# Patient Record
Sex: Male | Born: 1975 | Race: Black or African American | Hispanic: No | Marital: Single | Smoking: Current every day smoker
Health system: Southern US, Community
[De-identification: ages and names within clinical notes are randomized; demographics above are authoritative.]

## PROBLEM LIST (undated history)

## (undated) DIAGNOSIS — I10 Essential (primary) hypertension: Secondary | ICD-10-CM

## (undated) HISTORY — DX: Essential (primary) hypertension: I10

---

## 2009-01-18 ENCOUNTER — Ambulatory Visit: Payer: Self-pay | Admitting: Internal Medicine

## 2009-01-18 DIAGNOSIS — F528 Other sexual dysfunction not due to a substance or known physiological condition: Secondary | ICD-10-CM | POA: Insufficient documentation

## 2009-01-18 LAB — CONVERTED CEMR LAB
Albumin: 4.1 g/dL (ref 3.5–5.2)
Alkaline Phosphatase: 93 units/L (ref 39–117)
Basophils Absolute: 0.1 10*3/uL (ref 0.0–0.1)
Bilirubin Urine: NEGATIVE
CO2: 32 meq/L (ref 19–32)
Calcium: 9.3 mg/dL (ref 8.4–10.5)
Creatinine, Ser: 0.7 mg/dL (ref 0.4–1.5)
Eosinophils Absolute: 0 10*3/uL (ref 0.0–0.7)
Glucose, Bld: 83 mg/dL (ref 70–99)
HDL: 41 mg/dL (ref >39.00)
Hemoglobin, Urine: NEGATIVE
Hemoglobin: 16.8 g/dL (ref 13.0–17.0)
Ketones, ur: NEGATIVE mg/dL
Lymphocytes Relative: 35.7 % (ref 12.0–46.0)
MCHC: 35.4 g/dL (ref 30.0–36.0)
Neutro Abs: 3 10*3/uL (ref 1.4–7.7)
Neutrophils Relative %: 55.5 % (ref 43.0–77.0)
RDW: 13.5 % (ref 11.5–14.6)
Total Protein, Urine: NEGATIVE mg/dL
Triglycerides: 58 mg/dL (ref 0.0–149.0)
Urine Glucose: NEGATIVE mg/dL
VLDL: 11.6 mg/dL (ref 0.0–40.0)

## 2009-01-20 LAB — CONVERTED CEMR LAB: GC Probe Amp, Urine: NEGATIVE

## 2014-12-20 ENCOUNTER — Emergency Department (HOSPITAL_COMMUNITY)
Admission: EM | Admit: 2014-12-20 | Discharge: 2014-12-20 | Disposition: A | Discharge status: Home / Self Care | Payer: Managed Care, Other (non HMO) | Source: Home / Self Care | Attending: Emergency Medicine | Admitting: Emergency Medicine

## 2014-12-20 ENCOUNTER — Encounter (HOSPITAL_COMMUNITY): Payer: Self-pay | Admitting: Emergency Medicine

## 2014-12-20 DIAGNOSIS — H00013 Hordeolum externum right eye, unspecified eyelid: Secondary | ICD-10-CM

## 2014-12-20 MED ORDER — POLYMYXIN B-TRIMETHOPRIM 10000-0.1 UNIT/ML-% OP SOLN: 1.0000 [drp] | OPHTHALMIC | Status: DC

## 2014-12-20 NOTE — ED Notes (Signed)
Pt states that he has had eye irritation for 2 days in his right eye

## 2014-12-20 NOTE — Discharge Instructions (Signed)
You have a stye. Apply warm compresses as often as you can. Use the antibiotic eyedrop every 4 hours for the next 5 days. If no improvement by Thursday, please follow-up with eye doctor.

## 2014-12-20 NOTE — ED Provider Notes (Signed)
CSN: [id number]     Arrival date & time 12/20/14  [id number] History   First MD Initiated Contact with Patient 12/20/14 1125     No chief complaint on file.  (Consider location/radiation/quality/duration/timing/severity/associated sxs/prior Treatment) HPI He is a 39 year old man here for evaluation of right eye issue. He states when he woke up on Friday morning he had swelling and discomfort in the right upper eyelid. He reports some mild itching. He has been using over-the-counter eyedrops with minimal improvement. He does report some intermittent crusty drainage. No change in his vision. No redness of that eye.  No past medical history on file. No past surgical history on file. No family history on file. History  Substance Use Topics  . Smoking status: Not on file  . Smokeless tobacco: Not on file  . Alcohol Use: Not on file    Review of Systems  Constitutional: Negative for fever.  Eyes: Positive for pain, discharge and itching. Negative for photophobia, redness and visual disturbance.    Allergies  Review of patient's allergies indicates not on file.  Home Medications   Prior to Admission medications   Medication Sig Start Date End Date Taking? Authorizing Provider  trimethoprim-polymyxin b (POLYTRIM) ophthalmic solution Place 1 drop into the right eye every 4 (four) hours. For 5 days 12/20/14   Charm RingsErin J Honig, MD   BP 129/86 mmHg  Pulse 90  Temp(Src) 99.4 F (37.4 C) (Oral)  Resp 16  SpO2 98% Physical Exam  Constitutional: He is oriented to person, place, and time. He appears well-developed and well-nourished. No distress.  Eyes: Conjunctivae and EOM are normal. Pupils are equal, round, and reactive to light. Lids are everted and swept, no foreign bodies found. Right eye exhibits hordeolum.  Swelling and mild erythema of right upper eyelid  Cardiovascular: Normal rate.   Pulmonary/Chest: Effort normal.  Neurological: He is alert and oriented to person, place, and time.     ED Course  Procedures (including critical care time) Labs Review Labs Reviewed - No data to display  Imaging Review No results found.   MDM   1. Stye, right    Warm compresses. Polytrim eyedrops. Follow-up with eye doctor if no improvement in 4 days.    Charm RingsErin J Honig, MD 12/20/14 1149

## 2019-09-29 ENCOUNTER — Other Ambulatory Visit: Payer: Self-pay

## 2019-09-29 ENCOUNTER — Encounter (HOSPITAL_COMMUNITY): Payer: Self-pay

## 2019-09-29 ENCOUNTER — Emergency Department (HOSPITAL_COMMUNITY)
Admission: EM | Admit: 2019-09-29 | Discharge: 2019-09-29 | Disposition: A | Discharge status: Home / Self Care | Payer: 59 | Attending: Emergency Medicine | Admitting: Emergency Medicine

## 2019-09-29 ENCOUNTER — Emergency Department (HOSPITAL_COMMUNITY): Payer: 59

## 2019-09-29 DIAGNOSIS — I739 Peripheral vascular disease, unspecified: Secondary | ICD-10-CM

## 2019-09-29 DIAGNOSIS — Z20822 Contact with and (suspected) exposure to covid-19: Secondary | ICD-10-CM | POA: Diagnosis not present

## 2019-09-29 DIAGNOSIS — R945 Abnormal results of liver function studies: Secondary | ICD-10-CM | POA: Insufficient documentation

## 2019-09-29 DIAGNOSIS — R21 Rash and other nonspecific skin eruption: Secondary | ICD-10-CM | POA: Insufficient documentation

## 2019-09-29 DIAGNOSIS — R7989 Other specified abnormal findings of blood chemistry: Secondary | ICD-10-CM

## 2019-09-29 LAB — COMPREHENSIVE METABOLIC PANEL
ALT: 22 U/L (ref 0–44)
AST: 14 U/L — ABNORMAL LOW (ref 15–41)
Albumin: 3.6 g/dL (ref 3.5–5.0)
Alkaline Phosphatase: 173 U/L — ABNORMAL HIGH (ref 38–126)
Anion gap: 8 (ref 5–15)
BUN: 22 mg/dL — ABNORMAL HIGH (ref 6–20)
CO2: 28 mmol/L (ref 22–32)
Calcium: 9.1 mg/dL (ref 8.9–10.3)
Chloride: 97 mmol/L — ABNORMAL LOW (ref 98–111)
Creatinine, Ser: 0.71 mg/dL (ref 0.61–1.24)
GFR calc Af Amer: >60 mL/min (ref >60)
GFR calc non Af Amer: >60 mL/min (ref >60)
Glucose, Bld: 93 mg/dL (ref 70–99)
Potassium: 5 mmol/L (ref 3.5–5.1)
Sodium: 133 mmol/L — ABNORMAL LOW (ref 135–145)
Total Bilirubin: 0.7 mg/dL (ref 0.3–1.2)
Total Protein: 8 g/dL (ref 6.5–8.1)

## 2019-09-29 LAB — HEPATITIS PANEL, ACUTE
HCV Ab: NONREACTIVE
Hep A IgM: NONREACTIVE
Hep B C IgM: NONREACTIVE
Hepatitis B Surface Ag: NONREACTIVE

## 2019-09-29 LAB — CBC
HCT: 43.3 % (ref 39.0–52.0)
Hemoglobin: 14.6 g/dL (ref 13.0–17.0)
MCH: 36.3 pg — ABNORMAL HIGH (ref 26.0–34.0)
MCHC: 33.7 g/dL (ref 30.0–36.0)
MCV: 107.7 fL — ABNORMAL HIGH (ref 80.0–100.0)
Platelets: 336 10*3/uL (ref 150–400)
RBC: 4.02 MIL/uL — ABNORMAL LOW (ref 4.22–5.81)
RDW: 14.6 % (ref 11.5–15.5)
WBC: 23.3 10*3/uL — ABNORMAL HIGH (ref 4.0–10.5)
nRBC: 0.1 % (ref 0.0–0.2)

## 2019-09-29 LAB — LACTIC ACID, PLASMA: Lactic Acid, Venous: 1.1 mmol/L (ref 0.5–1.9)

## 2019-09-29 LAB — HIV ANTIBODY (ROUTINE TESTING W REFLEX): HIV Screen 4th Generation wRfx: NONREACTIVE

## 2019-09-29 LAB — RESPIRATORY PANEL BY RT PCR (FLU A&B, COVID)
Influenza A by PCR: NEGATIVE
Influenza B by PCR: NEGATIVE
SARS Coronavirus 2 by RT PCR: NEGATIVE

## 2019-09-29 LAB — LIPASE, BLOOD: Lipase: 150 U/L — ABNORMAL HIGH (ref 11–51)

## 2019-09-29 MED ORDER — SODIUM CHLORIDE 0.9 % IV BOLUS
1000.0000 mL | Freq: Once | INTRAVENOUS | Status: AC
Start: 2019-09-29 — End: 2019-09-29
  Administered 2019-09-29: 1000 mL via INTRAVENOUS

## 2019-09-29 MED ORDER — PREDNISONE 10 MG (21) PO TBPK: ORAL_TABLET | ORAL | 0 refills | Status: DC

## 2019-09-29 MED ORDER — DIPHENHYDRAMINE HCL 50 MG/ML IJ SOLN
25.0000 mg | Freq: Once | INTRAMUSCULAR | Status: AC
Start: 2019-09-29 — End: 2019-09-29
  Administered 2019-09-29: 25 mg via INTRAVENOUS
  Filled 2019-09-29: qty 1

## 2019-09-29 MED ORDER — IOHEXOL 300 MG/ML  SOLN
100.0000 mL | Freq: Once | INTRAMUSCULAR | Status: AC | PRN
  Administered 2019-09-29: 100 mL via INTRAVENOUS

## 2019-09-29 MED ORDER — SODIUM CHLORIDE (PF) 0.9 % IJ SOLN
INTRAMUSCULAR | Status: AC
  Filled 2019-09-29: qty 50

## 2019-09-29 MED ORDER — SODIUM CHLORIDE 0.9 % IV SOLN: INTRAVENOUS | Status: DC

## 2019-09-29 MED ORDER — DEXAMETHASONE SODIUM PHOSPHATE 10 MG/ML IJ SOLN
10.0000 mg | Freq: Once | INTRAMUSCULAR | Status: AC
  Administered 2019-09-29: 10 mg via INTRAVENOUS
  Filled 2019-09-29: qty 1

## 2019-09-29 MED ORDER — FAMOTIDINE IN NACL 20-0.9 MG/50ML-% IV SOLN
20.0000 mg | Freq: Once | INTRAVENOUS | Status: AC
  Administered 2019-09-29: 20 mg via INTRAVENOUS
  Filled 2019-09-29: qty 50

## 2019-09-29 NOTE — ED Provider Notes (Signed)
[facility] DEPT Provider Note   CSN: [id number] Arrival date & time: 09/29/19  1250     History Chief Complaint  Patient presents with  . Rash  . Abnormal Lab    Jesse Mendoza is a 44 y.o. male.  Pt presents to the ED today with a rash and abnormal labs.  Pt said Jesse Mendoza's had a rash for about a week. Jesse Mendoza went to UC last week and had blood work done and was then put on prednisone.  WBC elevated and LFTs elevated, so they told him to come back for repeat blood work.   WBC is still high and amylase/lipase are elevated.  Pt said the prednisone helped the rash a little, but it is still there.  Jesse Mendoza said it's itchy.  No f/c.  No abdominal pain.  Jesse Mendoza does drink daily.        History reviewed. No pertinent past medical history.  Patient Active Problem List   Diagnosis Date Noted  . ERECTILE DYSFUNCTION 01/18/2009    History reviewed. No pertinent surgical history.     Family History  Problem Relation Age of Onset  . Diabetes Mother     Social History   Tobacco Use  . Smoking status: Current Every Day Smoker    Packs/day: 0.50    Types: Cigarettes, Cigars  . Smokeless tobacco: Never Used  Substance Use Topics  . Alcohol use: Yes    Comment: about 4 times a week  . Drug use: Yes    Types: Marijuana    Comment: occasionally    Home Medications Prior to Admission medications   Medication Sig Start Date End Date Taking? Authorizing Provider  Ascorbic Acid (VITAMIN C) 1000 MG tablet Take 1,000 mg by mouth daily.   Yes [provider]  predniSONE (STERAPRED UNI-PAK 21 TAB) 10 MG (21) TBPK tablet Take 6 tabs for 2 days, then 5 for 2 days, then 4 for 2 days, then 3 for 2 days, 2 for 2 days, then 1 for 2 days 09/29/19   Isla Pence, MD  trimethoprim-polymyxin b (POLYTRIM) ophthalmic solution Place 1 drop into the right eye every 4 (four) hours. For 5 days Patient not taking: Reported on 09/29/2019 12/20/14   Melony Overly, MD    Allergies     Patient has no known allergies.  Review of Systems   Review of Systems  Skin: Positive for rash.  All other systems reviewed and are negative.   Physical Exam Updated Vital Signs BP (!) 151/98 (BP Location: Right Arm)   Pulse 97   Temp 98.7 F (37.1 C) (Oral)   Resp 15   Ht 5\' 6"  (1.676 m)   Wt 68 kg   SpO2 100%   BMI 24.21 kg/m   Physical Exam Vitals and nursing note reviewed.  Constitutional:      Appearance: Normal appearance.  HENT:     Head: Normocephalic and atraumatic.     Right Ear: External ear normal.     Left Ear: External ear normal.     Nose: Nose normal.     Mouth/Throat:     Mouth: Mucous membranes are moist.     Pharynx: Oropharynx is clear.     Comments: No intra-oral lesions Eyes:     Extraocular Movements: Extraocular movements intact.     Conjunctiva/sclera: Conjunctivae normal.     Pupils: Pupils are equal, round, and reactive to light.  Cardiovascular:     Rate and Rhythm: Regular rhythm. Tachycardia  present.     Pulses: Normal pulses.     Heart sounds: Normal heart sounds.  Pulmonary:     Effort: Pulmonary effort is normal.     Breath sounds: Normal breath sounds.  Abdominal:     General: Abdomen is flat. Bowel sounds are normal.     Palpations: Abdomen is soft.  Musculoskeletal:        General: Normal range of motion.     Cervical back: Normal range of motion and neck supple.  Skin:    Capillary Refill: Capillary refill takes less than 2 seconds.     Comments: See picture. Rash is mainly on arms and trunk.  Very little on LE.  No palmar lesions.  Neurological:     General: No focal deficit present.     Mental Status: Jesse Mendoza is alert and oriented to person, place, and time.  Psychiatric:        Mood and Affect: Mood normal.        Behavior: Behavior normal.        Thought Content: Thought content normal.        Judgment: Judgment normal.       ED Results / Procedures / Treatments   Labs (all labs ordered are listed, but only  abnormal results are displayed) Labs Reviewed  LIPASE, BLOOD - Abnormal; Notable for the following components:      Result Value   Lipase 150 (*)    All other components within normal limits  COMPREHENSIVE METABOLIC PANEL - Abnormal; Notable for the following components:   Sodium 133 (*)    Chloride 97 (*)    BUN 22 (*)    AST 14 (*)    Alkaline Phosphatase 173 (*)    All other components within normal limits  CBC - Abnormal; Notable for the following components:   WBC 23.3 (*)    RBC 4.02 (*)    MCV 107.7 (*)    MCH 36.3 (*)    All other components within normal limits  RESPIRATORY PANEL BY RT PCR (FLU A&B, COVID)  CULTURE, BLOOD (ROUTINE X 2)  CULTURE, BLOOD (ROUTINE X 2)  LACTIC ACID, PLASMA  URINALYSIS, ROUTINE W REFLEX MICROSCOPIC  RPR  HIV ANTIBODY (ROUTINE TESTING W REFLEX)  HEPATITIS PANEL, ACUTE  LACTIC ACID, PLASMA    EKG None  Radiology CT ABDOMEN PELVIS W CONTRAST  Result Date: 09/29/2019 CLINICAL DATA:  Multifocal skin rash, jaundice EXAM: CT ABDOMEN AND PELVIS WITH CONTRAST TECHNIQUE: Multidetector CT imaging of the abdomen and pelvis was performed using the standard protocol following bolus administration of intravenous contrast. CONTRAST:  OMNIPAQUE IOHEXOL 300 MG/ML  SOLN COMPARISON:  None. FINDINGS: Lower chest: No acute pleural or parenchymal lung disease. Hepatobiliary: No focal liver abnormality is seen. No gallstones, gallbladder wall thickening, or biliary dilatation. Pancreas: Unremarkable. No pancreatic ductal dilatation or surrounding inflammatory changes. Spleen: Normal in size without focal abnormality. Adrenals/Urinary Tract: Adrenal glands are unremarkable. Kidneys are normal, without renal calculi, focal lesion, or hydronephrosis. Bladder is unremarkable. Stomach/Bowel: No bowel obstruction or ileus. Normal gas-filled appendix right lower quadrant. Vascular/Lymphatic: No pathologic lymphadenopathy within the abdomen or pelvis. Moderate  atherosclerosis at the aortic bifurcation, with focal high-grade stenosis right common iliac artery estimated greater than 50%. The left superficial femoral artery is not identified and may be chronically occluded. The left profundus femoral is widely patent. Reproductive: Prostate is unremarkable. Other: No abdominal wall hernia or abnormality. No abdominopelvic ascites. Musculoskeletal: No acute displaced fracture. Reconstructed images  demonstrate no additional findings. IMPRESSION: 1. No acute intra-abdominal or intrapelvic process. No etiology to explain the clinical findings of jaundice. 2. Multifocal peripheral vascular disease involving the right common iliac and left superficial femoral artery. Please correlate with any symptoms of lower extremity claudication. Electronically Signed   By: Sharlet Salina M.D.   On: 09/29/2019 16:28    Procedures Procedures (including critical care time)  Medications Ordered in ED Medications  sodium chloride 0.9 % bolus 1,000 mL (0 mLs Intravenous Stopped 09/29/19 1724)    And  0.9 %  sodium chloride infusion ( Intravenous New Bag/Given 09/29/19 1537)  sodium chloride (PF) 0.9 % injection (has no administration in time range)  diphenhydrAMINE (BENADRYL) injection 25 mg (25 mg Intravenous Given 09/29/19 1538)  dexamethasone (DECADRON) injection 10 mg (10 mg Intravenous Given 09/29/19 1539)  famotidine (PEPCID) IVPB 20 mg premix (0 mg Intravenous Stopped 09/29/19 1724)  iohexol (OMNIPAQUE) 300 MG/ML solution 100 mL (100 mLs Intravenous Contrast Given 09/29/19 1559)    ED Course  I have reviewed the triage vital signs and the nursing notes.  Pertinent labs & imaging results that were available during my care of the patient were reviewed by me and considered in my medical decision making (see chart for details).    MDM Rules/Calculators/A&P                      Pt is afebrile and not septic appearing.  Jesse Mendoza is well appearing.  RPR and HIV and Hep C tests pending.   Pt's CT abd/pelvis negative other than peripheral vascular disease.  Pt's lfts are likely elevated due to alcohol use.  Pt is encouraged to stop smoking and drinking.  Jesse Mendoza does not have a pcp, but does have medical insurance.  Jesse Mendoza is told to establish with pcp.  Jesse Mendoza is given the number of vascular to f/u due to the severe pvd noted on CT.  Pt dose not have any leg pain.  Pt is stable for d/c.  Return if worse.   Final Clinical Impression(s) / ED Diagnoses Final diagnoses:  Rash  Elevated LFTs  Peripheral vascular disease (HCC)    Rx / DC Orders ED Discharge Orders         Ordered    predniSONE (STERAPRED UNI-PAK 21 TAB) 10 MG (21) TBPK tablet     09/29/19 1739           Jacalyn Lefevre, MD 09/29/19 1740

## 2019-09-29 NOTE — ED Triage Notes (Signed)
Patient reports that he has a rash on his hands, chest, back and genital area x 2 weeks. Patient states he went to an UC a week ago and had labs completed. Patient went back 2 days ago and labs repeated. Patient was told to come to the Ed for abnormal labs.  WBC-21.9, amylase-426, lipase-357

## 2019-09-29 NOTE — Discharge Instructions (Addendum)
Try to decrease alcohol intake.  Try to stop smoking.  Start new prednisone taper tomorrow.

## 2019-09-30 LAB — RPR
RPR Ser Ql: REACTIVE — AB
RPR Titer: >16

## 2019-10-01 LAB — T.PALLIDUM AB, TOTAL: T Pallidum Abs: REACTIVE — AB

## 2019-10-02 ENCOUNTER — Other Ambulatory Visit: Payer: Self-pay

## 2019-10-02 ENCOUNTER — Emergency Department (HOSPITAL_COMMUNITY)
Admission: EM | Admit: 2019-10-02 | Discharge: 2019-10-02 | Disposition: A | Discharge status: Home / Self Care | Payer: 59 | Attending: Emergency Medicine | Admitting: Emergency Medicine

## 2019-10-02 DIAGNOSIS — F17210 Nicotine dependence, cigarettes, uncomplicated: Secondary | ICD-10-CM | POA: Diagnosis not present

## 2019-10-02 DIAGNOSIS — A539 Syphilis, unspecified: Secondary | ICD-10-CM | POA: Diagnosis not present

## 2019-10-02 DIAGNOSIS — Z79899 Other long term (current) drug therapy: Secondary | ICD-10-CM | POA: Insufficient documentation

## 2019-10-02 MED ORDER — PENICILLIN G BENZATHINE 1200000 UNIT/2ML IM SUSP
2.4000 10*6.[IU] | Freq: Once | INTRAMUSCULAR | Status: AC
  Administered 2019-10-02: 2.4 10*6.[IU] via INTRAMUSCULAR
  Filled 2019-10-02: qty 4

## 2019-10-02 NOTE — Discharge Instructions (Addendum)
You have been diagnosed today with syphilis.  At this time there does not appear to be the presence of an emergent medical condition, however there is always the potential for conditions to change. Please read and follow the below instructions.  Please return to the Emergency Department immediately for any new or worsening symptoms. Please be sure to follow up with your Primary Care Provider within one week regarding your visit today; please call their office to schedule an appointment even if you are feeling better for a follow-up visit. You have been treated today for syphilis.  You will need a follow-up syphilis test called an RPR in 6 weeks - 3 months.  You may schedule this through your primary care doctor or through the health department.  Additionally your gonorrhea and Chlamydia tests are currently pending.  Please follow-up on the results on your MyChart, in 2-3 days.  If they are positive please seek treatment through your primary care doctor's office, the health department or if necessary here at the emergency department.  Please inform all of your sexual contacts/partners you have had for the past 2 years that they need to be tested and treated as well.  Please use safe sex with every sexual encounter.  Please avoid sexual activity until cleared by your primary care provider after follow-up testing in 6-12 weeks.  Get help right away if: You have severe chest pain. You have trouble walking or coordinating movements. You are confused. You lose vision or hearing. You have numbness in your arms or legs. You have a seizure. You faint. You have a severe headache that does not go away with medicine. You have any new/concerning or worsening of symptoms  Please read the additional information packets attached to your discharge summary.  Do not take your medicine if  develop an itchy rash, swelling in your mouth or lips, or difficulty breathing; call 911 and seek immediate emergency medical  attention if this occurs.  Note: Portions of this text may have been transcribed using voice recognition software. Every effort was made to ensure accuracy; however, inadvertent computerized transcription errors may still be present.

## 2019-10-02 NOTE — ED Triage Notes (Signed)
Patient states he was evaluated here Monday for rash all over body. Was prescribed medication but staff called patient today informing patient he has syphilis. Staff told patient to return to ED for treatment.

## 2019-10-02 NOTE — ED Provider Notes (Addendum)
[facility] DEPT Provider Note   CSN: [id number] Arrival date & time: 10/02/19  1637     History Chief Complaint  Patient presents with  . Exposure to STD    syphilis    Jesse Mendoza is a 44 y.o. male presents today after he was contacted by [facility] health for positive syphilis test.  Patient had been seen on 09/29/2019 for a rash that had previously been treated with steroids.  Patient had work-up during last ED visit including basic blood work, HIV/RPR, Covid test and CT abdomen pelvis.  Remarkable for leukocytosis, elevated alk phos, PVD on CT scan, positive RPR and T palladium.  Patient reports that since discharge 3 days ago his rash has not changed, has not noticed any new lesions.  He denies any pain to the areas and has had no additional concerns since discharge aside from his rash not improving.  He denies any fever/chills, vision changes, confusion, difficulty speaking, neck pain, chest pain/shortness breath, abdominal pain, nausea/vomiting or any additional concerns. HPI     No past medical history on file.  Patient Active Problem List   Diagnosis Date Noted  . ERECTILE DYSFUNCTION 01/18/2009    No past surgical history on file.     Family History  Problem Relation Age of Onset  . Diabetes Mother     Social History   Tobacco Use  . Smoking status: Current Every Day Smoker    Packs/day: 0.50    Types: Cigarettes, Cigars  . Smokeless tobacco: Never Used  Substance Use Topics  . Alcohol use: Yes    Comment: about 4 times a week  . Drug use: Yes    Types: Marijuana    Comment: occasionally    Home Medications Prior to Admission medications   Medication Sig Start Date End Date Taking? Authorizing Provider  Ascorbic Acid (VITAMIN C) 1000 MG tablet Take 1,000 mg by mouth daily.    [provider]  predniSONE (STERAPRED UNI-PAK 21 TAB) 10 MG (21) TBPK tablet Take 6 tabs for 2 days, then 5 for 2 days, then 4 for 2 days,  then 3 for 2 days, 2 for 2 days, then 1 for 2 days 09/29/19   Isla Pence, MD  trimethoprim-polymyxin b (POLYTRIM) ophthalmic solution Place 1 drop into the right eye every 4 (four) hours. For 5 days Patient not taking: Reported on 09/29/2019 12/20/14   Melony Overly, MD    Allergies    Patient has no known allergies.  Review of Systems   Review of Systems Ten systems are reviewed and are negative for acute change except as noted in the HPI  Physical Exam Updated Vital Signs BP (!) 158/97 (BP Location: Right Arm)   Pulse (!) 109   Temp 98.2 F (36.8 C) (Oral)   Resp 16   Ht 5' 6"  (1.676 m)   Wt 68 kg   SpO2 99%   BMI 24.21 kg/m   Physical Exam Constitutional:      General: He is not in acute distress.    Appearance: Normal appearance. He is well-developed. He is not ill-appearing or diaphoretic.  HENT:     Head: Normocephalic and atraumatic.     Right Ear: External ear normal.     Left Ear: External ear normal.     Nose: Nose normal.  Eyes:     General: Vision grossly intact. Gaze aligned appropriately.     Pupils: Pupils are equal, round, and reactive to light.  Neck:  Trachea: Trachea and phonation normal. No tracheal deviation.  Pulmonary:     Effort: Pulmonary effort is normal. No respiratory distress.  Abdominal:     General: There is no distension.     Palpations: Abdomen is soft.     Tenderness: There is no abdominal tenderness. There is no guarding or rebound.  Musculoskeletal:        General: Normal range of motion.     Cervical back: Normal range of motion.  Skin:    General: Skin is warm and dry.     Findings: Rash present.     Comments: Extensive rash appears similar to picture taken 3 days ago, no signs of superimposed infection.  New lesion noticed to right sole during examination as pictured below.  Neurological:     Mental Status: He is alert.     GCS: GCS eye subscore is 4. GCS verbal subscore is 5. GCS motor subscore is 6.     Comments: Speech  is clear and goal oriented, follows commands Major Cranial nerves without deficit, no facial droop Moves extremities without ataxia, coordination intact  Psychiatric:        Behavior: Behavior normal.         ED Results / Procedures / Treatments   Labs (all labs ordered are listed, but only abnormal results are displayed) Labs Reviewed  GC/CHLAMYDIA PROBE AMP ([place]) NOT AT [facility]    EKG None  Radiology No results found.  Procedures Procedures (including critical care time)  Medications Ordered in ED Medications  penicillin g benzathine (BICILLIN LA) 1200000 UNIT/2ML injection 2.4 Million Units (2.4 Million Units Intramuscular Given 10/02/19 1738)    ED Course  I have reviewed the triage vital signs and the nursing notes.  Pertinent labs & imaging results that were available during my care of the patient were reviewed by me and considered in my medical decision making (see chart for details).  Clinical Course as of Oct 01 1856  Fri Oct 02, 2019  1731 Dr. Johnnye Sima; 2.4 million units once; follow-up RPR in 6 weeks-3 months   [BM]    Clinical Course User Index [BM] Gari Crown   MDM Rules/Calculators/A&P                     44 year old otherwise healthy male presents today for 2 weeks of full body rash that is now involving the sole of his right foot.  Not improved with steroid and antihistamines.  He had syphilis test 3 days ago which has just resulted positive and he presents today for treatment.  HIV test was negative.  He has no other complaints or concerns today.  Suspect patient's rash today due to syphilis infection. Patient denies any difficulty breathing or swallowing.  Pt has a patent airway without stridor and is handling secretions without difficulty; no angioedema. Not tender to touch. No concern for superimposed infection. No concern for SJS, TEN, TSS, tick borne illness or other life-threatening condition. Additionally patient denies urinary  symptoms, penile drainage, testicular pain/swelling or dysuria/hematuria.  Will send outpatient GC chlamydia urine test, no indication for treatment at this time.  Patient is aware to follow-up on his GC chlamydia urine test on his MyChart account. He is well-appearing in no acute distress, nontoxic, no neurologic complaints or other infectious-like symptoms.  I discussed the case with infectious disease specialist Dr. Johnnye Sima who advised single treatment of 2,400,000 units intramuscular penicillin.  Patient is to follow-up for repeat RPR test  in 6 weeks - 3 months.  No additional recommendations.  I reevaluated the patient he is resting comfortably no acute distress states understanding of care plan and is agreeable.  Will give him referral to PCP office for follow-up testing and additionally will give referral to [facility].  I advised patient that he should have all sexual contacts for the past 2 years tested and treated as well.  Advised safe sex practices and to avoid sexual contact until follow-up testing and cleared by PCP.  At this time there does not appear to be any evidence of an acute emergency medical condition and the patient appears stable for discharge with appropriate outpatient follow up. Diagnosis was discussed with patient who verbalizes understanding of care plan and is agreeable to discharge. I have discussed return precautions with patient who verbalizes understanding of return precautions. Patient encouraged to follow-up with their PCP. All questions answered.  Patient's case discussed with Dr. Johnney Killian who agrees with plan to discharge with follow-up.   Note: Portions of this report may have been transcribed using voice recognition software. Every effort was made to ensure accuracy; however, inadvertent computerized transcription errors may still be present. Final Clinical Impression(s) / ED Diagnoses Final diagnoses:  Syphilis    Rx / DC Orders ED  Discharge Orders    None       Deliah Boston, PA-C 10/02/19 1859    Gari Crown 10/02/19 1906    Charlesetta Shanks, MD 10/05/19 1452

## 2019-10-04 LAB — CULTURE, BLOOD (ROUTINE X 2)
Culture: NO GROWTH
Culture: NO GROWTH
Special Requests: ADEQUATE

## 2019-10-06 LAB — GC/CHLAMYDIA PROBE AMP (~~LOC~~) NOT AT ARMC
Chlamydia: NEGATIVE
Neisseria Gonorrhea: NEGATIVE

## 2019-11-13 ENCOUNTER — Other Ambulatory Visit: Payer: Self-pay

## 2019-11-13 ENCOUNTER — Ambulatory Visit: Payer: 59

## 2019-11-15 NOTE — Progress Notes (Signed)
Subjective:    Patient ID: Jesse Mendoza, male    DOB: Sep 14, 1975, 44 y.o.   MRN: [id number]  43 y.o.M here to establish PCP and post ED f/u This patient was seen twice in February for a rash the first visit was nonconclusive but the second visit confirmed the patient had active syphilis with a diffuse classic rash for same.  Infectious disease was consulted and they recommended 2.4 million dose Bicillin penicillin G IM and the patient received that dose on February 5.  The patient had a negative HIV study at that time basic metabolic blood work was unremarkable CT of the abdomen was unremarkable there was elements of elevated alk phosphatase and leukocytosis also CT scan did show peripheral vascular disease.  RPR NT palladium were positive on the sexually-transmitted disease screen.  Gonorrhea was negative as was chlamydia.  The patient states since receiving the injection of penicillin he is markedly better.  The rash is resolving no longer itching or bothersome.  Note there was also a lesion on the patient's sole of his right foot which was new as well.  Note he did have sexual encounter several years ago which he states may have been the cause of his syphilis.  He has informed his prior sexual contact of his diagnosis.  The patient currently has employment with [employer] [place] [employer] and has [employer]  Note at the last visit he also had elevated lipase and did have a history of extensive alcohol use he has since quit smoking and drinking alcohol at this time.  Note he also admits to having poor dentition with carious teeth and he has an upcoming dental appointment  Note there is no symptoms referable to neuro logic syphilis  Note blood pressure has been elevated on prior visits with day the patient arrives at 180/110  He denies any prior history of hypertension  History reviewed. No pertinent past medical history.   Family History  Problem Relation Age of Onset  . Diabetes  Mother   . Stroke Father      Social History   Socioeconomic History  . Marital status: Single    Spouse name: Not on file  . Number of children: Not on file  . Years of education: Not on file  . Highest education level: Not on file  Occupational History  . Not on file  Tobacco Use  . Smoking status: Current Every Day Smoker    Packs/day: 0.50    Types: Cigarettes, Cigars  . Smokeless tobacco: Never Used  Substance and Sexual Activity  . Alcohol use: Not Currently    Comment: about 4 times a week  . Drug use: Yes    Types: Marijuana    Comment: occasionally  . Sexual activity: Not on file  Other Topics Concern  . Not on file  Social History Narrative  . Not on file   Social Determinants of Health   Financial Resource Strain:   . Difficulty of Paying Living Expenses:   Food Insecurity:   . Worried About Charity fundraiser in the Last Year:   . Arboriculturist in the Last Year:   Transportation Needs:   . Film/video editor (Medical):   Marland Kitchen Lack of Transportation (Non-Medical):   Physical Activity:   . Days of Exercise per Week:   . Minutes of Exercise per Session:   Stress:   . Feeling of Stress :   Social Connections:   . Frequency of Communication with  Friends and Family:   . Frequency of Social Gatherings with Friends and Family:   . Attends Religious Services:   . Active Member of Clubs or Organizations:   . Attends Archivist Meetings:   Marland Kitchen Marital Status:   Intimate Partner Violence:   . Fear of Current or Ex-Partner:   . Emotionally Abused:   Marland Kitchen Physically Abused:   . Sexually Abused:      No Known Allergies   Outpatient Medications Prior to Visit  Medication Sig Dispense Refill  . Ascorbic Acid (VITAMIN C) 1000 MG tablet Take 1,000 mg by mouth daily.    . NON FORMULARY C-Moss and Bladder Wrack Capsules Daily    . predniSONE (STERAPRED UNI-PAK 21 TAB) 10 MG (21) TBPK tablet Take 6 tabs for 2 days, then 5 for 2 days, then 4 for 2 days,  then 3 for 2 days, 2 for 2 days, then 1 for 2 days (Patient not taking: Reported on 11/17/2019) 42 tablet 0  . trimethoprim-polymyxin b (POLYTRIM) ophthalmic solution Place 1 drop into the right eye every 4 (four) hours. For 5 days (Patient not taking: Reported on 09/29/2019) 10 mL 0   No facility-administered medications prior to visit.      Review of Systems Constitutional:   No  weight loss, night sweats,  Fevers, chills, fatigue, lassitude. HEENT:   No headaches,  Difficulty swallowing,  Tooth/dental problems,  Sore throat,                No sneezing, itching, ear ache, nasal congestion, post nasal drip,   CV:  No chest pain,  Orthopnea, PND, swelling in lower extremities, anasarca, dizziness, palpitations  GI  No heartburn, indigestion, abdominal pain, nausea, vomiting, diarrhea, change in bowel habits, loss of appetite  Resp: No shortness of breath with exertion or at rest.  No excess mucus, no productive cough,  No non-productive cough,  No coughing up of blood.  No change in color of mucus.  No wheezing.  No chest wall deformity  Skin:  rash no other  lesions.  GU: no dysuria, change in color of urine, no urgency or frequency.  No flank pain.  MS:  No joint pain or swelling.  No decreased range of motion.  No back pain.  Psych:  No change in mood or affect. No depression or anxiety.  No memory loss.     Objective:   Physical Exam Vitals:   11/17/19 1520 11/17/19 1521 11/17/19 1536  BP: (!) 173/115 (!) 173/98 (!) 181/110  Pulse: 91    SpO2: 98%    Weight: 164 lb 3.2 oz (74.5 kg)    Height: 5' 6"  (1.676 m)      Gen: Pleasant, well-nourished, in no distress,  normal affect  ENT: No lesions,  mouth clear,  oropharynx clear, no postnasal drip  Neck: No JVD, no TMG, no carotid bruits  Lungs: No use of accessory muscles, no dullness to percussion, clear without rales or rhonchi  Cardiovascular: RRR, heart sounds normal, no murmur or gallops, no peripheral  edema  Abdomen: soft and NT, no HSM,  BS normal  Musculoskeletal: No deformities, no cyanosis or clubbing  Neuro: alert, non focal  Skin: Warm, lesions on the forearms and feet have faded substantially since treatment  All labs in the epic system have been reviewed       Assessment & Plan:  I personally reviewed all images and lab data in the [facility] system as well as any outside  material available during this office visit and agree with the  radiology impressions.   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  Good response to penicillin therapy is observed  Plan would be to continue observation and repeat RPR and complete blood count at this visit may yet require additional antibiotics  Note HIV has already been checked and is negative  The patient is notified his sexual contacts and knows to use protected sex in the future  Alcohol-induced acute pancreatitis without infection or necrosis Elevated lipase compatible with alcohol-induced acute pancreatitis without infection or necrosis  CT scan of the abdomen did not show pancreatitis at the last ED visit  Plan is to repeat lipase in the patient knows to continue to abstain from alcohol use   Diagnoses and all orders for this visit:  Essential hypertension -     Comprehensive metabolic panel -     CBC with Differential/Platelet; Future -     Thyroid Panel With TSH -     CBC with Differential/Platelet  Syphilis -     RPR -     CBC with Differential/Platelet; Future -     CBC with Differential/Platelet  Alcohol-induced acute pancreatitis without infection or necrosis -     CBC with Differential/Platelet; Future -     Lipase -     CBC with Differential/Platelet  Other orders -      amLODipine (NORVASC) 10 MG tablet; Take 1 tablet (10 mg total) by mouth daily.

## 2019-11-17 ENCOUNTER — Ambulatory Visit: Payer: 59 | Attending: Critical Care Medicine | Admitting: Critical Care Medicine

## 2019-11-17 ENCOUNTER — Other Ambulatory Visit: Payer: Self-pay

## 2019-11-17 ENCOUNTER — Encounter: Payer: Self-pay | Admitting: Critical Care Medicine

## 2019-11-17 VITALS — BP 181/110 | HR 91 | Ht 66.0 in | Wt 164.2 lb

## 2019-11-17 DIAGNOSIS — K852 Alcohol induced acute pancreatitis without necrosis or infection: Secondary | ICD-10-CM | POA: Diagnosis not present

## 2019-11-17 DIAGNOSIS — Z8619 Personal history of other infectious and parasitic diseases: Secondary | ICD-10-CM | POA: Insufficient documentation

## 2019-11-17 DIAGNOSIS — I10 Essential (primary) hypertension: Secondary | ICD-10-CM | POA: Diagnosis not present

## 2019-11-17 DIAGNOSIS — A539 Syphilis, unspecified: Secondary | ICD-10-CM | POA: Diagnosis not present

## 2019-11-17 HISTORY — DX: Alcohol induced acute pancreatitis without necrosis or infection: K85.20

## 2019-11-17 MED ORDER — AMLODIPINE BESYLATE 10 MG PO TABS: 10.0000 mg | ORAL_TABLET | Freq: Every day | ORAL | 3 refills | Status: DC

## 2019-11-17 NOTE — Assessment & Plan Note (Signed)
Essential hypertension  Plan is to begin amlodipine 10 mg daily and will follow-up short-term for blood pressure check  We will also repeat metabolic panel and thyroid panels

## 2019-11-17 NOTE — Patient Instructions (Signed)
Start amlodipine one daily  Repeat RPR and other labs to be obtained.  Return 6 weeks for BP recheck

## 2019-11-17 NOTE — Progress Notes (Signed)
New Patient   H/Fu

## 2019-11-17 NOTE — Assessment & Plan Note (Signed)
Elevated lipase compatible with alcohol-induced acute pancreatitis without infection or necrosis  CT scan of the abdomen did not show pancreatitis at the last ED visit  Plan is to repeat lipase in the patient knows to continue to abstain from alcohol use

## 2019-11-17 NOTE — Assessment & Plan Note (Addendum)
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

## 2019-11-19 ENCOUNTER — Telehealth: Payer: Self-pay | Admitting: Critical Care Medicine

## 2019-11-19 LAB — CBC WITH DIFFERENTIAL/PLATELET
Basophils Absolute: 0 10*3/uL (ref 0.0–0.2)
Basos: 1 %
EOS (ABSOLUTE): 0.2 10*3/uL (ref 0.0–0.4)
Eos: 2 %
Hematocrit: 43.6 % (ref 37.5–51.0)
Hemoglobin: 14.9 g/dL (ref 13.0–17.7)
Immature Grans (Abs): 0.1 10*3/uL (ref 0.0–0.1)
Immature Granulocytes: 1 %
Lymphocytes Absolute: 3 10*3/uL (ref 0.7–3.1)
Lymphs: 37 %
MCH: 35.6 pg — ABNORMAL HIGH (ref 26.6–33.0)
MCHC: 34.2 g/dL (ref 31.5–35.7)
MCV: 104 fL — ABNORMAL HIGH (ref 79–97)
Monocytes Absolute: 1 10*3/uL — ABNORMAL HIGH (ref 0.1–0.9)
Monocytes: 13 %
Neutrophils Absolute: 3.7 10*3/uL (ref 1.4–7.0)
Neutrophils: 46 %
Platelets: 272 10*3/uL (ref 150–450)
RBC: 4.19 x10E6/uL (ref 4.14–5.80)
RDW: 12.4 % (ref 11.6–15.4)
WBC: 8 10*3/uL (ref 3.4–10.8)

## 2019-11-19 LAB — COMPREHENSIVE METABOLIC PANEL
ALT: 24 IU/L (ref 0–44)
AST: 18 IU/L (ref 0–40)
Albumin/Globulin Ratio: 1.5 (ref 1.2–2.2)
Albumin: 4 g/dL (ref 4.0–5.0)
Alkaline Phosphatase: 128 IU/L — ABNORMAL HIGH (ref 39–117)
BUN/Creatinine Ratio: 16 (ref 9–20)
BUN: 10 mg/dL (ref 6–24)
Bilirubin Total: <0.2 mg/dL (ref 0.0–1.2)
CO2: 25 mmol/L (ref 20–29)
Calcium: 9.2 mg/dL (ref 8.7–10.2)
Chloride: 103 mmol/L (ref 96–106)
Creatinine, Ser: 0.61 mg/dL — ABNORMAL LOW (ref 0.76–1.27)
GFR calc Af Amer: 143 mL/min/{1.73_m2} (ref >59)
GFR calc non Af Amer: 124 mL/min/{1.73_m2} (ref >59)
Globulin, Total: 2.6 g/dL (ref 1.5–4.5)
Glucose: 74 mg/dL (ref 65–99)
Potassium: 4 mmol/L (ref 3.5–5.2)
Sodium: 140 mmol/L (ref 134–144)
Total Protein: 6.6 g/dL (ref 6.0–8.5)

## 2019-11-19 LAB — THYROID PANEL WITH TSH
Free Thyroxine Index: 1.5 (ref 1.2–4.9)
T3 Uptake Ratio: 26 % (ref 24–39)
T4, Total: 5.7 ug/dL (ref 4.5–12.0)
TSH: 1.31 u[IU]/mL (ref 0.450–4.500)

## 2019-11-19 LAB — RPR, QUANT+TP ABS (REFLEX)
Rapid Plasma Reagin, Quant: 1:8 {titer} — ABNORMAL HIGH
T Pallidum Abs: REACTIVE — AB

## 2019-11-19 LAB — LIPASE: Lipase: 79 U/L — ABNORMAL HIGH (ref 13–78)

## 2019-11-19 LAB — RPR: RPR Ser Ql: REACTIVE — AB

## 2019-11-19 NOTE — Telephone Encounter (Signed)
I spoke to the patient and he is aware of his labs and we will call him back for a f/u appt with me in 2 months face to face

## 2019-11-19 NOTE — Telephone Encounter (Signed)
Patient called and requested to speak with PCP, patient stated he was returning his call. Please follow up at your earliest convenience.

## 2020-01-05 ENCOUNTER — Other Ambulatory Visit: Payer: Self-pay

## 2020-01-05 ENCOUNTER — Ambulatory Visit: Payer: 59 | Attending: Critical Care Medicine | Admitting: Critical Care Medicine

## 2020-01-05 ENCOUNTER — Encounter: Payer: Self-pay | Admitting: Critical Care Medicine

## 2020-01-05 VITALS — BP 163/102 | HR 86 | Temp 98.1°F | Ht 66.0 in | Wt 161.2 lb

## 2020-01-05 DIAGNOSIS — Z720 Tobacco use: Secondary | ICD-10-CM | POA: Diagnosis not present

## 2020-01-05 DIAGNOSIS — I10 Essential (primary) hypertension: Secondary | ICD-10-CM

## 2020-01-05 DIAGNOSIS — A539 Syphilis, unspecified: Secondary | ICD-10-CM

## 2020-01-05 MED ORDER — LOSARTAN POTASSIUM-HCTZ 50-12.5 MG PO TABS: 1.0000 | ORAL_TABLET | Freq: Every day | ORAL | 3 refills | Status: DC

## 2020-01-05 MED ORDER — AMLODIPINE BESYLATE 10 MG PO TABS: 10.0000 mg | ORAL_TABLET | Freq: Every day | ORAL | 3 refills | Status: DC

## 2020-01-05 NOTE — Patient Instructions (Signed)
Start Losartan HCT one daily  Continue amlodipine  Get a covid vaccine   COVID-19 Vaccine Information can be found at: [email or web address] For questions related to vaccine distribution or appointments, please email [email or web address] or call [phone].    Return dr Delford Field 4 months

## 2020-01-05 NOTE — Assessment & Plan Note (Signed)
History of active syphilis now resolving after antibiotic therapy

## 2020-01-05 NOTE — Progress Notes (Signed)
BP f/u  Per pt he just got his teeth pulled yesterday

## 2020-01-05 NOTE — Assessment & Plan Note (Signed)
Counseling was given for tobacco use he is currently smoking a pack a day I recommended nicotine replacement therapy

## 2020-01-05 NOTE — Progress Notes (Signed)
Subjective:    Patient ID: Jesse Mendoza, male    DOB: 1976/03/10, 44 y.o.   MRN: [id number]  43 y.o.M here to establish PCP and post ED f/u This patient was seen twice in February for a rash the first visit was nonconclusive but the second visit confirmed the patient had active syphilis with a diffuse classic rash for same.  Infectious disease was consulted and they recommended 2.4 million dose Bicillin penicillin G IM and the patient received that dose on February 5.  The patient had a negative HIV study at that time basic metabolic blood work was unremarkable CT of the abdomen was unremarkable there was elements of elevated alk phosphatase and leukocytosis also CT scan did show peripheral vascular disease.  RPR NT palladium were positive on the sexually-transmitted disease screen.  Gonorrhea was negative as was chlamydia.  The patient states since receiving the injection of penicillin he is markedly better.  The rash is resolving no longer itching or bothersome.  Note there was also a lesion on the patient's sole of his right foot which was new as well.  Note he did have sexual encounter several years ago which he states may have been the cause of his syphilis.  He has informed his prior sexual contact of his diagnosis.  The patient currently has employment with [employer] [employer] and has [employer]  Note at the last visit he also had elevated lipase and did have a history of extensive alcohol use he has since quit smoking and drinking alcohol at this time.  Note he also admits to having poor dentition with carious teeth and he has an upcoming dental appointment  Note there is no symptoms referable to neuro logic syphilis  Note blood pressure has been elevated on prior visits with day the patient arrives at 180/110  He denies any prior history of hypertension  01/05/2020 Since last visit the patient states shortness of breath and chest pain are longer present.  The patient is  no longer drinking alcohol at this time.  With the exception of one drink he took over Mother's Day weekend. He has been noting increased blood pressures on his home blood pressure monitoring cuff.  Today's he comes in is 163/102.    Past Medical History:  Diagnosis Date  . Alcohol-induced acute pancreatitis without infection or necrosis 11/17/2019     Family History  Problem Relation Age of Onset  . Diabetes Mother   . Stroke Father      Social History   Socioeconomic History  . Marital status: Single    Spouse name: Not on file  . Number of children: Not on file  . Years of education: Not on file  . Highest education level: Not on file  Occupational History  . Not on file  Tobacco Use  . Smoking status: Current Every Day Smoker    Packs/day: 0.50    Types: Cigarettes, Cigars  . Smokeless tobacco: Never Used  Substance and Sexual Activity  . Alcohol use: Not Currently    Comment: about 4 times a week  . Drug use: Yes    Types: Marijuana    Comment: occasionally  . Sexual activity: Not on file  Other Topics Concern  . Not on file  Social History Narrative  . Not on file   Social Determinants of Health   Financial Resource Strain:   . Difficulty of Paying Living Expenses:   Food Insecurity:   . Worried About [employer] of  Food in the Last Year:   . [place] in the Last Year:   Transportation Needs:   . Film/video editor (Medical):   Marland Kitchen Lack of Transportation (Non-Medical):   Physical Activity:   . Days of Exercise per Week:   . Minutes of Exercise per Session:   Stress:   . Feeling of Stress :   Social Connections:   . Frequency of Communication with Friends and Family:   . Frequency of Social Gatherings with Friends and Family:   . Attends Religious Services:   . Active Member of Clubs or Organizations:   . Attends Archivist Meetings:   Marland Kitchen Marital Status:   Intimate Partner Violence:   . Fear of Current or Ex-Partner:   .  Emotionally Abused:   Marland Kitchen Physically Abused:   . Sexually Abused:      No Known Allergies   Outpatient Medications Prior to Visit  Medication Sig Dispense Refill  . Ascorbic Acid (VITAMIN C) 1000 MG tablet Take 1,000 mg by mouth daily.    . NON FORMULARY C-Moss and Bladder Wrack Capsules Daily    . amLODipine (NORVASC) 10 MG tablet Take 1 tablet (10 mg total) by mouth daily. 30 tablet 3   No facility-administered medications prior to visit.      Review of Systems Constitutional:   No  weight loss, night sweats,  Fevers, chills, fatigue, lassitude. HEENT:   No headaches,  Difficulty swallowing,  Tooth/dental problems,  Sore throat,                No sneezing, itching, ear ache, nasal congestion, post nasal drip,   CV:  No chest pain,  Orthopnea, PND, swelling in lower extremities, anasarca, dizziness, palpitations  GI  No heartburn, indigestion, abdominal pain, nausea, vomiting, diarrhea, change in bowel habits, loss of appetite  Resp: No shortness of breath with exertion or at rest.  No excess mucus, no productive cough,  No non-productive cough,  No coughing up of blood.  No change in color of mucus.  No wheezing.  No chest wall deformity  Skin:  rash no other  lesions.  GU: no dysuria, change in color of urine, no urgency or frequency.  No flank pain.  MS:  No joint pain or swelling.  No decreased range of motion.  No back pain.  Psych:  No change in mood or affect. No depression or anxiety.  No memory loss.     Objective:   Physical Exam Vitals:   01/05/20 0844 01/05/20 0845  BP: (!) 165/111 (!) 163/102  Pulse: 86   Temp: 98.1 F (36.7 C)   TempSrc: Temporal   SpO2: 100%   Weight: 161 lb 3.2 oz (73.1 kg)   Height: 5' 6" (1.676 m)     Gen: Pleasant, well-nourished, in no distress,  normal affect  ENT: No lesions,  mouth clear,  oropharynx clear, no postnasal drip  Neck: No JVD, no TMG, no carotid bruits  Lungs: No use of accessory muscles, no dullness to  percussion, clear without rales or rhonchi  Cardiovascular: RRR, heart sounds normal, no murmur or gallops, no peripheral edema  Abdomen: soft and NT, no HSM,  BS normal  Musculoskeletal: No deformities, no cyanosis or clubbing  Neuro: alert, non focal  Skin: Warm, lesions on the forearms and feet have faded substantially since treatment  All labs in the epic system have been reviewed       Assessment & Plan:  I  personally reviewed all images and lab data in the [facility] system as well as any outside material available during this office visit and agree with the  radiology impressions.   Essential hypertension Hypertension with poor control at this time will begin losartan HCT at 50/12.5 daily and increase amlodipine to 10 mg daily  Patient is to continue to avoid salt in diet  Syphilis History of active syphilis now resolving after antibiotic therapy  Tobacco use Counseling was given for tobacco use he is currently smoking a pack a day I recommended nicotine replacement therapy   Diagnoses and all orders for this visit:  Essential hypertension  Syphilis  Tobacco use  Other orders -     losartan-hydrochlorothiazide (HYZAAR) 50-12.5 MG tablet; Take 1 tablet by mouth daily. -     amLODipine (NORVASC) 10 MG tablet; Take 1 tablet (10 mg total) by mouth daily.

## 2020-01-05 NOTE — Assessment & Plan Note (Signed)
Hypertension with poor control at this time will begin losartan HCT at 50/12.5 daily and increase amlodipine to 10 mg daily  Patient is to continue to avoid salt in diet

## 2020-04-02 ENCOUNTER — Other Ambulatory Visit: Payer: Self-pay | Admitting: Critical Care Medicine

## 2021-05-01 ENCOUNTER — Ambulatory Visit: Payer: 59 | Admitting: Critical Care Medicine

## 2021-05-09 ENCOUNTER — Other Ambulatory Visit: Payer: Self-pay

## 2021-05-09 ENCOUNTER — Encounter: Payer: Self-pay | Admitting: Critical Care Medicine

## 2021-05-09 ENCOUNTER — Ambulatory Visit: Payer: 59 | Attending: Critical Care Medicine | Admitting: Critical Care Medicine

## 2021-05-09 VITALS — BP 199/114 | HR 105 | Ht 65.0 in | Wt 157.2 lb

## 2021-05-09 DIAGNOSIS — I10 Essential (primary) hypertension: Secondary | ICD-10-CM | POA: Diagnosis not present

## 2021-05-09 DIAGNOSIS — F17290 Nicotine dependence, other tobacco product, uncomplicated: Secondary | ICD-10-CM

## 2021-05-09 DIAGNOSIS — Z23 Encounter for immunization: Secondary | ICD-10-CM

## 2021-05-09 DIAGNOSIS — Z720 Tobacco use: Secondary | ICD-10-CM

## 2021-05-09 DIAGNOSIS — A539 Syphilis, unspecified: Secondary | ICD-10-CM

## 2021-05-09 MED ORDER — AMLODIPINE BESYLATE 10 MG PO TABS: 10.0000 mg | ORAL_TABLET | Freq: Every day | ORAL | 3 refills | Status: DC

## 2021-05-09 MED ORDER — VALSARTAN-HYDROCHLOROTHIAZIDE 320-25 MG PO TABS: 1.0000 | ORAL_TABLET | Freq: Every day | ORAL | 3 refills | Status: DC

## 2021-05-09 MED ORDER — NICOTINE POLACRILEX 4 MG MT LOZG: LOZENGE | OROMUCOSAL | 4 refills | Status: DC

## 2021-05-09 NOTE — Assessment & Plan Note (Signed)
Plan to begin valsartan HCT 320/25 and amlodipine 10 mg both daily  Plan to repeat metabolic panel

## 2021-05-09 NOTE — Patient Instructions (Signed)
Start valsartan HCT 1 daily for blood pressure and amlodipine 1 daily for blood pressure  Stop smoking per our discussion use nicotine lozenge 4 mg 3 times daily allow dissolve in the mouth  Labs today include metabolic panel blood counts thyroid function and syphilis antibody levels  Return to see Franky Macho our clinical pharmacist in 2 weeks for smoking cessation counseling and blood pressure recheck  Return to see Dr. Delford Field in 3 months  Focus on a healthy diet with low salt see attachment  Pneumonia vaccine was given

## 2021-05-09 NOTE — Assessment & Plan Note (Signed)
Recheck syphilis titers

## 2021-05-09 NOTE — Assessment & Plan Note (Signed)
  .   Current smoking consumption amount: 3 cigars daily  . Dicsussion on advise to quit smoking and smoking impacts: Cardiovascular impact  . Patient's willingness to quit: Willing to quit  . Methods to quit smoking discussed: Nicotine replacement with lozenge 4 mg 3 times daily and behavioral modification  . Medication management of smoking session drugs discussed: Nicotine lozenge  . Resources provided:  AVS   . Setting quit date not established  . Follow-up arranged 2 months   Time spent counseling the patient: 5 minutes

## 2021-05-09 NOTE — Progress Notes (Signed)
Established Patient Office Visit  Subjective:  Patient ID: Jesse Mendoza, male    DOB: Mar 28, 1976  Age: 45 y.o. MRN: [id number]  CC:  Chief Complaint  Patient presents with   Hypertension    HPI Montie Swiderski presents for hypertension follow-up note this patient's not been seen in over a year.  Patient has run out of his blood pressure medicine and was not compliant with a low-salt diet on arrival blood pressure was quite elevated 199/114.  Patient denies any real symptoms from this.  He still smokes 3 cigars daily.  He works at [employer].  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 No pertinent surgical history.  Family History  Problem Relation Age of Onset   Diabetes Mother    Stroke Father     Social History   Socioeconomic History   Marital status: Single    Spouse name: Not on file   Number of children: Not on file   Years of education: Not on file   Highest education level: Not on file  Occupational History   Not on file  Tobacco Use   Smoking status: Every Day    Packs/day: 0.50    Types: Cigarettes, Cigars   Smokeless tobacco: Never  Vaping Use   Vaping Use: Never used  Substance and Sexual Activity   Alcohol use: Not Currently    Comment: about 4 times a week   Drug use: Yes    Types: Marijuana    Comment: occasionally   Sexual activity: Not on file  Other Topics Concern   Not on file  Social History Narrative   Not on file   Social Determinants of Health   Financial Resource Strain: Not on file  Food Insecurity: Not on file  Transportation Needs: Not on file  Physical Activity: Not on file  Stress: Not on file  Social  Connections: Not on file  Intimate Partner Violence: Not on file    Outpatient Medications Prior to Visit  Medication Sig Dispense Refill   amLODipine (NORVASC) 10 MG tablet Take 1 tablet (10 mg total) by mouth daily. (Patient not taking: Reported on 05/09/2021) 90 tablet 3   Ascorbic Acid (VITAMIN C) 1000 MG tablet Take 1,000 mg by mouth daily. (Patient not taking: Reported on 05/09/2021)     losartan-hydrochlorothiazide (HYZAAR) 50-12.5 MG tablet Take 1 tablet by mouth daily. (Patient not taking: Reported on 05/09/2021) 90 tablet 3   NON FORMULARY C-Moss and Bladder Wrack Capsules Daily (Patient not taking: Reported on 05/09/2021)     No facility-administered medications prior to visit.    No Known Allergies  ROS Review of Systems  Constitutional:  Negative for fatigue and fever.  HENT: Negative.    Eyes:  Negative for visual disturbance.  Respiratory: Negative.  Negative for cough and shortness of breath.   Cardiovascular:  Negative for chest pain, palpitations and leg swelling.  Gastrointestinal: Negative.   Musculoskeletal: Negative.   Neurological:  Negative for headaches.  Psychiatric/Behavioral:  Negative for sleep disturbance and suicidal ideas. The patient is not nervous/anxious.      Objective:    Physical Exam Vitals reviewed.  Constitutional:      Appearance: Normal appearance. He is  well-developed. He is not diaphoretic.  HENT:     Head: Normocephalic and atraumatic.     Nose: No nasal deformity, septal deviation, mucosal edema or rhinorrhea.     Right Sinus: No maxillary sinus tenderness or frontal sinus tenderness.     Left Sinus: No maxillary sinus tenderness or frontal sinus tenderness.     Mouth/Throat:     Pharynx: No oropharyngeal exudate.  Eyes:     General: No scleral icterus.    Conjunctiva/sclera: Conjunctivae normal.     Pupils: Pupils are equal, round, and reactive to light.  Neck:     Thyroid: No thyromegaly.     Vascular: No carotid bruit or  JVD.     Trachea: Trachea normal. No tracheal tenderness or tracheal deviation.  Cardiovascular:     Rate and Rhythm: Normal rate and regular rhythm.     Chest Wall: PMI is not displaced.     Pulses: Normal pulses. No decreased pulses.     Heart sounds: Normal heart sounds, S1 normal and S2 normal. Heart sounds not distant. No murmur heard. No systolic murmur is present.  No diastolic murmur is present.    No friction rub. No gallop. No S3 or S4 sounds.  Pulmonary:     Effort: No tachypnea, accessory muscle usage or respiratory distress.     Breath sounds: No stridor. No decreased breath sounds, wheezing, rhonchi or rales.  Chest:     Chest wall: No tenderness.  Abdominal:     General: Bowel sounds are normal. There is no distension.     Palpations: Abdomen is soft. Abdomen is not rigid.     Tenderness: There is no abdominal tenderness. There is no guarding or rebound.  Musculoskeletal:        General: Normal range of motion.     Cervical back: Normal range of motion and neck supple. No edema, erythema or rigidity. No muscular tenderness. Normal range of motion.  Lymphadenopathy:     Head:     Right side of head: No submental or submandibular adenopathy.     Left side of head: No submental or submandibular adenopathy.     Cervical: No cervical adenopathy.  Skin:    General: Skin is warm and dry.     Coloration: Skin is not pale.     Findings: No rash.     Nails: There is no clubbing.  Neurological:     Mental Status: He is alert and oriented to person, place, and time.     Sensory: No sensory deficit.  Psychiatric:        Speech: Speech normal.        Behavior: Behavior normal.    BP (!) 199/114   Pulse (!) 105   Ht 5\' 5"  (1.651 m)   Wt 157 lb 3.2 oz (71.3 kg)   SpO2 100%   BMI 26.16 kg/m  Wt Readings from Last 3 Encounters:  05/09/21 157 lb 3.2 oz (71.3 kg)  01/05/20 161 lb 3.2 oz (73.1 kg)  11/17/19 164 lb 3.2 oz (74.5 kg)     Health Maintenance Due  Topic  Date Due   Pneumococcal Vaccine 83-33 Years old (1 - PCV) Never done   COVID-19 Vaccine (3 - Booster) 08/09/2020   TETANUS/TDAP  01/08/2021   INFLUENZA VACCINE  Never done    There are no preventive care reminders to display for this patient.  Lab Results  Component Value Date   TSH 1.310 11/17/2019   Lab Results  Component Value Date   WBC 8.0 11/17/2019   HGB 14.9 11/17/2019   HCT 43.6 11/17/2019   MCV 104 (H) 11/17/2019   PLT 272 11/17/2019   Lab Results  Component Value Date   NA 140 11/17/2019   K 4.0 11/17/2019   CO2 25 11/17/2019   GLUCOSE 74 11/17/2019   BUN 10 11/17/2019   CREATININE 0.61 (L) 11/17/2019   BILITOT <0.2 11/17/2019   ALKPHOS 128 (H) 11/17/2019   AST 18 11/17/2019   ALT 24 11/17/2019   PROT 6.6 11/17/2019   ALBUMIN 4.0 11/17/2019   CALCIUM 9.2 11/17/2019   ANIONGAP 8 09/29/2019   Lab Results  Component Value Date   CHOL 203 (H) 01/18/2009   Lab Results  Component Value Date   HDL 41.00 01/18/2009   No results found for: [facility] Lab Results  Component Value Date   TRIG 58.0 01/18/2009   Lab Results  Component Value Date   CHOLHDL 5 01/18/2009   No results found for: HGBA1C    Assessment & Plan:   Problem List Items Addressed This Visit       Cardiovascular and Mediastinum   Essential hypertension    Plan to begin valsartan HCT 320/25 and amlodipine 10 mg both daily  Plan to repeat metabolic panel      Relevant Medications   amLODipine (NORVASC) 10 MG tablet   valsartan-hydrochlorothiazide (DIOVAN-HCT) 320-25 MG tablet   Other Relevant Orders   Basic Metabolic Panel   CBC with Differential/Platelet   Thyroid Panel With TSH     Other   Syphilis - Primary    Recheck syphilis titers      Relevant Orders   T.pallidum Ab, Total   Tobacco use       Current smoking consumption amount: 3 cigars daily  Dicsussion on advise to quit smoking and smoking impacts: Cardiovascular impact  Patient's willingness to quit:  Willing to quit  Methods to quit smoking discussed: Nicotine replacement with lozenge 4 mg 3 times daily and behavioral modification  Medication management of smoking session drugs discussed: Nicotine lozenge  Resources provided:  AVS   Setting quit date not established  Follow-up arranged 2 months   Time spent counseling the patient: 5 minutes        Other Visit Diagnoses     Need for pneumococcal vaccine       Relevant Orders   Pneumococcal conjugate vaccine 20-valent (Completed)       Meds ordered this encounter  Medications   amLODipine (NORVASC) 10 MG tablet    Sig: Take 1 tablet (10 mg total) by mouth daily.    Dispense:  90 tablet    Refill:  3   valsartan-hydrochlorothiazide (DIOVAN-HCT) 320-25 MG tablet    Sig: Take 1 tablet by mouth daily.    Dispense:  90 tablet    Refill:  3   nicotine polacrilex (NICORETTE MINI) 4 MG lozenge    Sig: Use one three times daily to quit smoking    Dispense:  100 tablet    Refill:  4  Patient declined flu vaccine was given a pneumococcal vaccine  Follow-up: Return in about 3 months (around 08/08/2021).    Shan Levans, MD

## 2021-05-11 ENCOUNTER — Telehealth: Payer: Self-pay

## 2021-05-11 ENCOUNTER — Telehealth (HOSPITAL_COMMUNITY): Payer: Self-pay

## 2021-05-11 LAB — BASIC METABOLIC PANEL
BUN/Creatinine Ratio: 10 (ref 9–20)
BUN: 11 mg/dL (ref 6–24)
CO2: 23 mmol/L (ref 20–29)
Calcium: 9.5 mg/dL (ref 8.7–10.2)
Chloride: 102 mmol/L (ref 96–106)
Creatinine, Ser: 1.06 mg/dL (ref 0.76–1.27)
Glucose: 67 mg/dL (ref 65–99)
Potassium: 4.2 mmol/L (ref 3.5–5.2)
Sodium: 139 mmol/L (ref 134–144)
eGFR: 89 mL/min/{1.73_m2} (ref >59)

## 2021-05-11 LAB — CBC WITH DIFFERENTIAL/PLATELET
Basophils Absolute: 0 10*3/uL (ref 0.0–0.2)
Basos: 0 %
EOS (ABSOLUTE): 0.1 10*3/uL (ref 0.0–0.4)
Eos: 1 %
Hematocrit: 46 % (ref 37.5–51.0)
Hemoglobin: 15.9 g/dL (ref 13.0–17.7)
Immature Grans (Abs): 0 10*3/uL (ref 0.0–0.1)
Immature Granulocytes: 0 %
Lymphocytes Absolute: 3.1 10*3/uL (ref 0.7–3.1)
Lymphs: 35 %
MCH: 35.7 pg — ABNORMAL HIGH (ref 26.6–33.0)
MCHC: 34.6 g/dL (ref 31.5–35.7)
MCV: 103 fL — ABNORMAL HIGH (ref 79–97)
Monocytes Absolute: 0.9 10*3/uL (ref 0.1–0.9)
Monocytes: 10 %
Neutrophils Absolute: 4.8 10*3/uL (ref 1.4–7.0)
Neutrophils: 54 %
Platelets: 243 10*3/uL (ref 150–450)
RBC: 4.46 x10E6/uL (ref 4.14–5.80)
RDW: 12.3 % (ref 11.6–15.4)
WBC: 9 10*3/uL (ref 3.4–10.8)

## 2021-05-11 LAB — THYROID PANEL WITH TSH
Free Thyroxine Index: 2.3 (ref 1.2–4.9)
T3 Uptake Ratio: 26 % (ref 24–39)
T4, Total: 8.7 ug/dL (ref 4.5–12.0)
TSH: 1.91 u[IU]/mL (ref 0.450–4.500)

## 2021-05-11 LAB — T.PALLIDUM AB, TOTAL: Treponema pallidum Antibodies: REACTIVE — AB

## 2021-05-11 NOTE — Telephone Encounter (Signed)
Contacted pt to go over lab results pt is aware of results and DOB has been confirmed.

## 2021-05-17 ENCOUNTER — Telehealth: Payer: Self-pay | Admitting: Critical Care Medicine

## 2021-05-17 DIAGNOSIS — A539 Syphilis, unspecified: Secondary | ICD-10-CM

## 2021-05-17 NOTE — Telephone Encounter (Signed)
Copied from CRM [id number]. Topic: General - Other >> May 11, 2021  9:31 AM Gwenlyn Fudge wrote: Reason for CRM: Elfredia Nevins, from [facility] Dept, calling on behalf of pt stating that he is needing to have info regarding the pts most recent syphilis lab and is requesting to have a call back. Please advise. >> May 16, 2021  9:19 AM Darron Doom wrote: Elfredia Nevins with the [facility] Dept would like a call back from Dr Florene Route nurse today please needing information on testing patient had. Please call Ph# [phone]

## 2021-05-19 NOTE — Telephone Encounter (Signed)
Spoke to Jesse Mendoza with Health Dept.  Advised that per PCP, syphilis test shows no active infection. No ATB needed.   Jesse Mendoza states the TPA will always show reactive. A RPR titer is needed to show if patient has new infection. Patient would need to return to office for lab draw for titer.

## 2021-05-19 NOTE — Telephone Encounter (Signed)
His syphilis test shows exposure no active infection. No abx needed

## 2021-05-19 NOTE — Telephone Encounter (Signed)
Trepsure order made

## 2021-05-22 ENCOUNTER — Other Ambulatory Visit: Payer: Self-pay

## 2021-05-22 ENCOUNTER — Ambulatory Visit: Payer: 59 | Attending: Critical Care Medicine | Admitting: Pharmacist

## 2021-05-22 VITALS — BP 124/85 | HR 103

## 2021-05-22 DIAGNOSIS — I10 Essential (primary) hypertension: Secondary | ICD-10-CM | POA: Diagnosis not present

## 2021-05-22 NOTE — Progress Notes (Signed)
   S:    Patient arrives in good spirits. Presents to the clinic for hypertension evaluation, counseling, and management. Patient was referred and last seen by Primary Care Provider on 05/09/2021. At that visit his BP 199/114 and pt reported being out of his BP meds.  At today's visit, patient reports he took both his BP meds at 7:30-8AM this morning. His last cigar was ~2-3h prior to his visit. He reports fatigue with exertion since re-starting his BP meds. Denies any chest pain, dizziness, bilateral lower extremity edema, headaches or visual disturbances  Medication adherence report.   Current BP Medications include:  amlodipine 10 mg, Diovan-HCTZ 320-25 daily  Antihypertensives tried in the past include: losartan-HCTZ 50-12.5 daily  Dietary habits include: not addressed at this visit Exercise habits include:desk-job Family / Social history: Diabetes- Mother; Stroke- Father  ASCVD risk factors include: HTN, tobacco abuse, family history of stroke   O:  Vitals:   05/22/21 1044  BP: 124/85  Pulse: (!) 103    Home BP readings: does not check as he does not have a cuff at home.   Last 3 Office BP readings: BP Readings from Last 3 Encounters:  05/22/21 124/85  05/09/21 (!) 199/114  01/05/20 (!) 163/102   BMET    Component Value Date/Time   NA 139 05/09/2021 1138   K 4.2 05/09/2021 1138   CL 102 05/09/2021 1138   CO2 23 05/09/2021 1138   GLUCOSE 67 05/09/2021 1138   GLUCOSE 93 09/29/2019 1326   BUN 11 05/09/2021 1138   CREATININE 1.06 05/09/2021 1138   CALCIUM 9.5 05/09/2021 1138   GFRNONAA 124 11/17/2019 1619   GFRAA 143 11/17/2019 1619   Renal function: Estimated Creatinine Clearance: 78.2 mL/min (by C-G formula based on SCr of 1.06 mg/dL).  Clinical ASCVD: No  The ASCVD Risk score (Arnett DK, et al., 2019) failed to calculate for the following reasons:   Cannot find a previous HDL lab   Cannot find a previous total cholesterol lab  A/P: Hypertension  longstanding, today's BP shows tremendous improvement since restarting BP meds. Currently close to controlled on current medications. BP Goal = < 130/80 mmHg. Medication adherence reported. Patient's HR has been elevated at the past 2 visits, which patient attributes to nervousness. Will continue to monitor.  -Continued current regimen.  -F/u labs ordered - CMP, RPR for PCP -Counseled on lifestyle modifications for blood pressure control including reduced dietary sodium, increased exercise, adequate sleep.  Results reviewed and written information provided.   Total time in face-to-face counseling 20 minutes.   F/U Pharmacy Clinic Visit in 1 month.   Valeda Malm, Pharm.D. PGY-1 Pharmacy Resident 05/22/2021 11:02 AM

## 2021-05-22 NOTE — Telephone Encounter (Signed)
Pt states he has an appt this morning. Will get lab test at that time.

## 2021-05-23 LAB — BASIC METABOLIC PANEL
BUN/Creatinine Ratio: 21 — ABNORMAL HIGH (ref 9–20)
BUN: 29 mg/dL — ABNORMAL HIGH (ref 6–24)
CO2: 21 mmol/L (ref 20–29)
Calcium: 9.7 mg/dL (ref 8.7–10.2)
Chloride: 97 mmol/L (ref 96–106)
Creatinine, Ser: 1.4 mg/dL — ABNORMAL HIGH (ref 0.76–1.27)
Glucose: 97 mg/dL (ref 70–99)
Potassium: 4.5 mmol/L (ref 3.5–5.2)
Sodium: 132 mmol/L — ABNORMAL LOW (ref 134–144)
eGFR: 64 mL/min/{1.73_m2} (ref >59)

## 2021-05-23 NOTE — Telephone Encounter (Signed)
Denyse Amass with department of health and human services is calling to get the results of RPR test

## 2021-05-23 NOTE — Telephone Encounter (Signed)
Lab was not drawn on 05/23/2021 lab tech will add RPR as a add on and pt and health department will be notified of results.

## 2021-05-25 LAB — RPR, QUANT: RPR, Quant: 1:1 {titer} — ABNORMAL HIGH

## 2021-05-25 LAB — SPECIMEN STATUS REPORT

## 2021-05-25 LAB — RPR W/REFLEX TO TREPSURE: RPR: REACTIVE — AB

## 2021-05-25 NOTE — Telephone Encounter (Signed)
Jesse Mendoza with the [facility] Dept would like a call back from a nurse to discuss patients recent Syphilis test results please needing information. Please call Jesse Mendoza at Ph# [phone]

## 2021-05-25 NOTE — Telephone Encounter (Signed)
Mr Jesse Mendoza did disconnect the call before nurse Alycia picked up the call. Please be advised

## 2021-06-19 ENCOUNTER — Other Ambulatory Visit: Payer: Self-pay

## 2021-06-19 ENCOUNTER — Ambulatory Visit: Payer: 59 | Attending: Critical Care Medicine | Admitting: Pharmacist

## 2021-06-19 VITALS — BP 112/75 | HR 92

## 2021-06-19 DIAGNOSIS — I10 Essential (primary) hypertension: Secondary | ICD-10-CM

## 2021-06-19 NOTE — Progress Notes (Signed)
   S:    PCP: Dr. Delford Field  Patient arrives in good spirits. Presents to the clinic for hypertension evaluation, counseling, and management. Patient was referred and last seen by Primary Care Provider on 05/09/2021 and last seen by pharmacy on 05/22/2021.  Medication adherence reported. Patient reports taking blood pressure medications prior to visit and denies any missed doses in the last 2 weeks. He continues to endorse fatigue. Denies any chest pain, dizziness, bilateral lower extremity edema, headaches or visual disturbances  Current BP Medications include:  amlodipine 10 mg, valsartan-HCTZ 320-25 daily  Antihypertensives tried in the past include: losartan-HCTZ 50-12.5 daily  Dietary habits include: cold cuts, sandwiches, reports cutting back on fast food Exercise habits include: none due to fatigue and burning sensation in muscles  Family / Social history: Family Hx: diabetes (mother), stroke (father)  Social Hx: current smoker, reports cutting back - interested in lozenges; reports no alcohol recently  ASCVD risk factors include: HTN, tobacco abuse, family history of stroke   O:  Vitals:   06/19/21 1002  BP: 112/75  Pulse: 92   Home BP readings: denies at home readings due to no cuff  Last 3 Office BP readings: BP Readings from Last 3 Encounters:  06/19/21 112/75  05/22/21 124/85  05/09/21 (!) 199/114   BMET    Component Value Date/Time   NA 132 (L) 05/22/2021 1056   K 4.5 05/22/2021 1056   CL 97 05/22/2021 1056   CO2 21 05/22/2021 1056   GLUCOSE 97 05/22/2021 1056   GLUCOSE 93 09/29/2019 1326   BUN 29 (H) 05/22/2021 1056   CREATININE 1.40 (H) 05/22/2021 1056   CALCIUM 9.7 05/22/2021 1056   GFRNONAA 124 11/17/2019 1619   GFRAA 143 11/17/2019 1619   Renal function: CrCl cannot be calculated (Patient's most recent lab result is older than the maximum 21 days allowed.).  Clinical ASCVD: No  The ASCVD Risk score (Arnett DK, et al., 2019) failed to calculate for  the following reasons:   Cannot find a previous HDL lab   Cannot find a previous total cholesterol lab  A/P: Hypertension Goal: To have a goal blood pressure < 130/80 mmHg.  Hypertension longstanding - currently at goal. Medication adherence reported. Patient reports taking blood pressure medications prior to visit and denied any missed doses in the last 2 weeks. Denies any at home readings.   Patient's HR has been elevated at the past 2 visits, which patient attributes to nervousness - will continue to monitor.  - Continued current regimen.  - BMET ordered today  - Counseled on lifestyle modifications for blood pressure control including reduced dietary sodium, increased exercise, adequate sleep.  Follow up with Dr. Delford Field on 08/08/2021  Results reviewed and written information provided.   Total time in face-to-face counseling 20 minutes.    Patient seen by: Johnston Ebbs, Student Pharmacist [employer] 2023  Butch Penny, PharmD, [place], CPP Clinical Pharmacist [facility] [phone]

## 2021-06-20 LAB — BASIC METABOLIC PANEL
BUN/Creatinine Ratio: 15 (ref 9–20)
BUN: 19 mg/dL (ref 6–24)
CO2: 23 mmol/L (ref 20–29)
Calcium: 9.4 mg/dL (ref 8.7–10.2)
Chloride: 100 mmol/L (ref 96–106)
Creatinine, Ser: 1.29 mg/dL — ABNORMAL HIGH (ref 0.76–1.27)
Glucose: 95 mg/dL (ref 70–99)
Potassium: 5 mmol/L (ref 3.5–5.2)
Sodium: 137 mmol/L (ref 134–144)
eGFR: 71 mL/min/{1.73_m2} (ref >59)

## 2021-08-08 ENCOUNTER — Ambulatory Visit: Payer: 59 | Admitting: Critical Care Medicine

## 2021-08-08 NOTE — Progress Notes (Incomplete)
Established Patient Office Visit  Subjective:  Patient ID: Kentley Blyden, male    DOB: [birth year]  Age: 45 y.o. MRN: [id number]  CC: No chief complaint on file.   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  Family History  Problem Relation Age of Onset   Diabetes Mother    Stroke Father     Social History   Socioeconomic History   Marital status: Single    Spouse name: Not on file   Number of children: Not on file   Years of education: Not on file   Highest education level: Not on file  Occupational History   Not on file  Tobacco Use   Smoking status: Every Day    Packs/day: 0.50    Types: Cigarettes, Cigars   Smokeless tobacco: Never  Vaping Use   Vaping Use: Never used  Substance and Sexual Activity   Alcohol use: Not Currently    Comment: about 4 times a week   Drug use: Yes    Types: Marijuana    Comment: occasionally   Sexual activity: Not on file  Other Topics Concern   Not on file  Social History Narrative   Not on file   Social Determinants of Health   Financial Resource Strain: Not on file  Food Insecurity: Not on file  Transportation Needs: Not on file  Physical Activity: Not on file  Stress: Not on file  Social Connections: Not on file  Intimate Partner Violence: Not on file    Outpatient Medications Prior to Visit  Medication Sig Dispense Refill   amLODipine (NORVASC) 10 MG tablet Take 1 tablet (10 mg total) by mouth daily. 90 tablet 3   nicotine polacrilex (NICORETTE MINI) 4 MG lozenge Use one three times daily to quit smoking 100 tablet 4   valsartan-hydrochlorothiazide (DIOVAN-HCT) 320-25 MG tablet Take 1 tablet by mouth daily. 90 tablet 3   No facility-administered medications prior to visit.    No Known Allergies  ROS Review of Systems     Objective:    Physical Exam  There were no vitals taken for this visit. Wt Readings from Last 3 Encounters:  05/09/21 157 lb 3.2 oz (71.3 kg)  01/05/20 161 lb 3.2 oz (73.1 kg)  11/17/19 164 lb 3.2 oz (74.5 kg)     Health Maintenance Due  Topic Date Due   COVID-19 Vaccine (3 - Booster) 05/04/2020   TETANUS/TDAP  01/08/2021    There are no preventive care reminders to display for this patient.  Lab Results  Component Value Date   TSH 1.910 05/09/2021   Lab Results  Component Value Date   WBC 9.0 05/09/2021   HGB 15.9 05/09/2021   HCT 46.0 05/09/2021   MCV 103 (H) 05/09/2021   PLT 243 05/09/2021   Lab Results  Component Value Date   NA 137 06/19/2021   K 5.0 06/19/2021   CO2 23 06/19/2021   GLUCOSE 95 06/19/2021   BUN 19 06/19/2021   CREATININE 1.29 (H) 06/19/2021   BILITOT <0.2 11/17/2019   ALKPHOS 128 (H) 11/17/2019   AST 18 11/17/2019   ALT 24 11/17/2019   PROT 6.6 11/17/2019   ALBUMIN 4.0 11/17/2019   CALCIUM 9.4 06/19/2021   ANIONGAP 8 09/29/2019   EGFR 71 06/19/2021   Lab Results  Component Value  Date   CHOL 203 (H) 01/18/2009   Lab Results  Component Value Date   HDL 41.00 01/18/2009   No results found for: [facility] Lab Results  Component Value Date   TRIG 58.0 01/18/2009   Lab Results  Component Value Date   CHOLHDL 5 01/18/2009   No results found for: HGBA1C    Assessment & Plan:   Problem List Items Addressed This Visit   None   No orders of the defined types were placed in this encounter.   Follow-up: No follow-ups on file.    Asencion Noble, MD

## 2021-08-29 IMAGING — CT CT ABD-PELV W/ CM
2 of 5 series · 15 of 46 positions shown, 17 images · IV contrast (omnipaque)
Comparison: None.

CLINICAL DATA: Multifocal skin rash, jaundice

EXAM:
CT ABDOMEN AND PELVIS WITH CONTRAST
TECHNIQUE: Multidetector CT imaging of the abdomen and pelvis was performed
using the standard protocol following bolus administration of
intravenous contrast.
CONTRAST:  100mL OMNIPAQUE IOHEXOL 300 MG/ML  SOLN

[Series 2: axial st · axial · 0.64mm/px · z∈[-237,+143]mm · 12 of 88 slices shown, 14 images]
[im 6/88  soft-tissue]
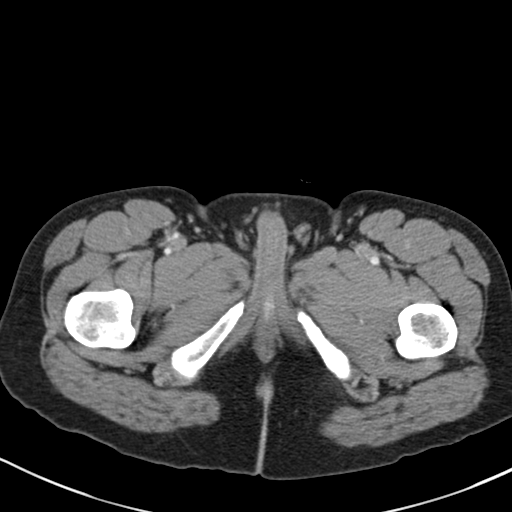
[im 6/88  bone]
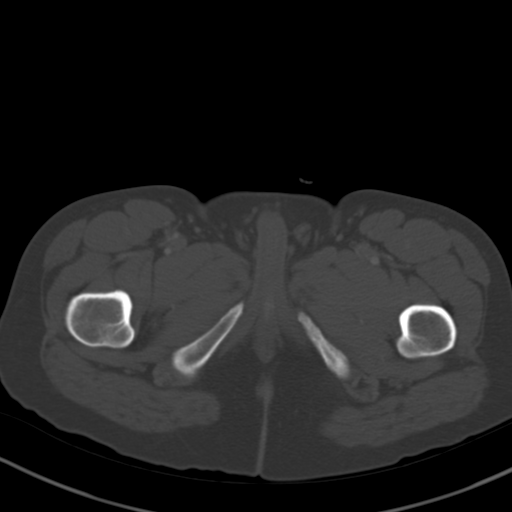
[im 12/88  soft-tissue]
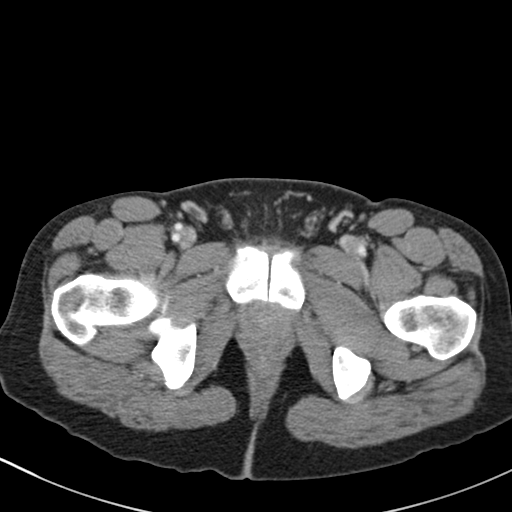
[im 18/88  soft-tissue]
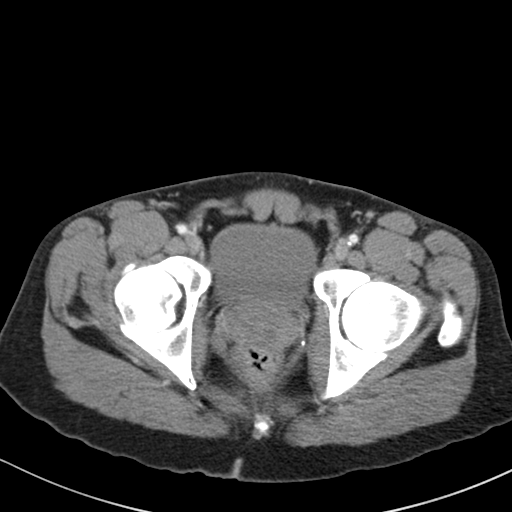
[im 30/88  soft-tissue]
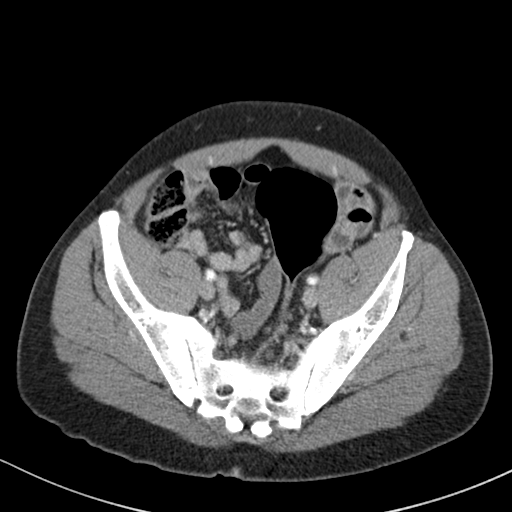
[im 35/88  soft-tissue]
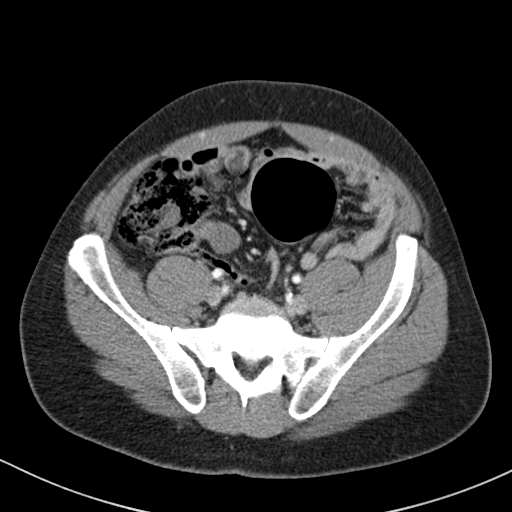
[im 41/88  soft-tissue]
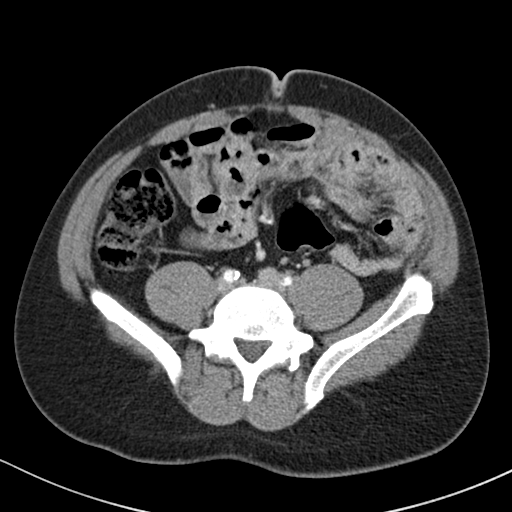
[im 47/88  soft-tissue]
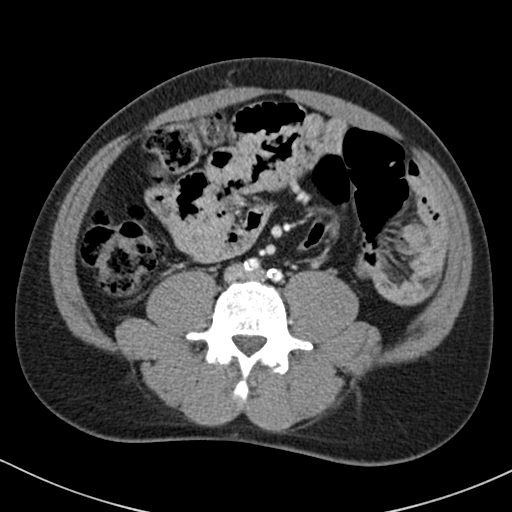
[im 53/88  soft-tissue]
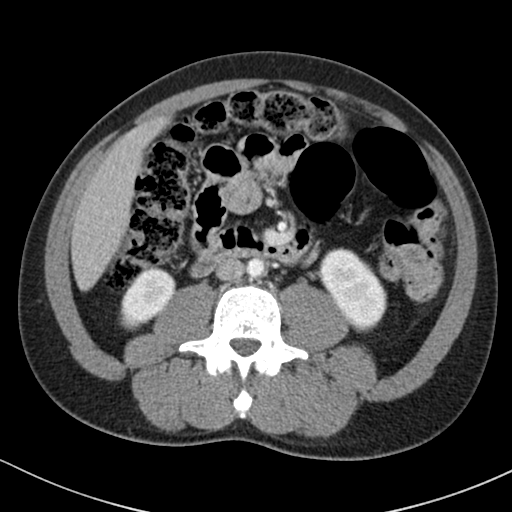
[im 59/88  soft-tissue]
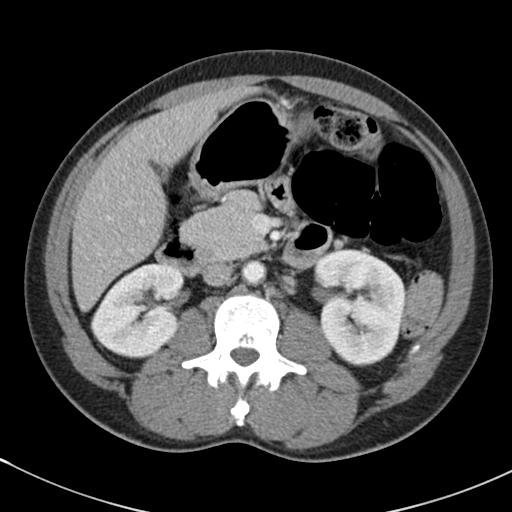
[im 59/88  bone]
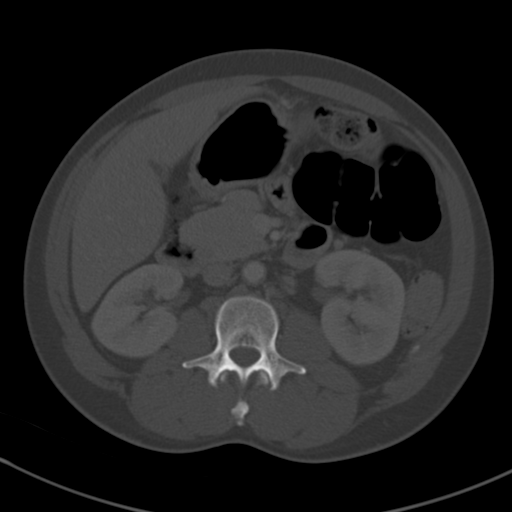
[im 70/88  soft-tissue]
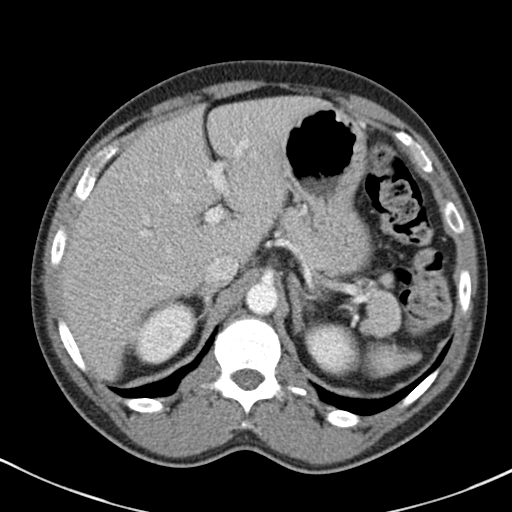
[im 76/88  soft-tissue]
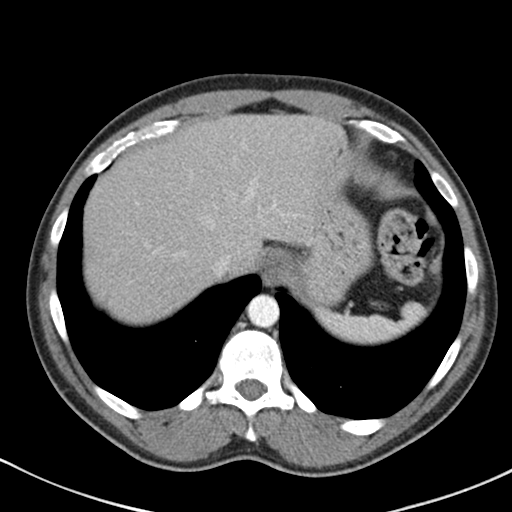
[im 82/88  soft-tissue]
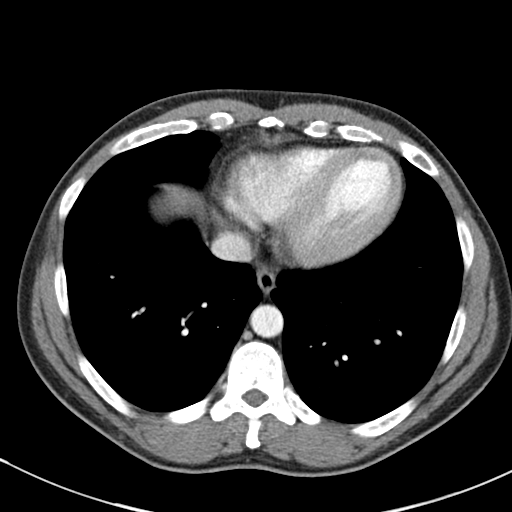

[Series 5: coronal st · coronal · 0.58mm/px · 3 of 143 slices shown]
[im 48/143  soft-tissue]
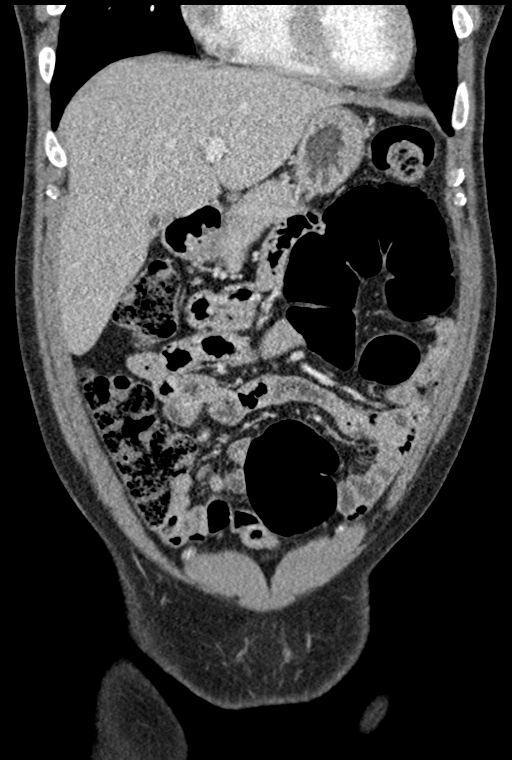
[im 64/143  soft-tissue]
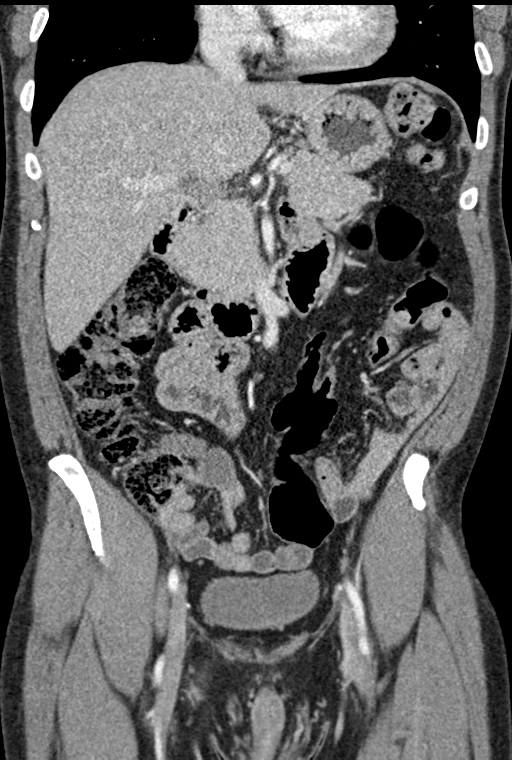
[im 79/143  soft-tissue]
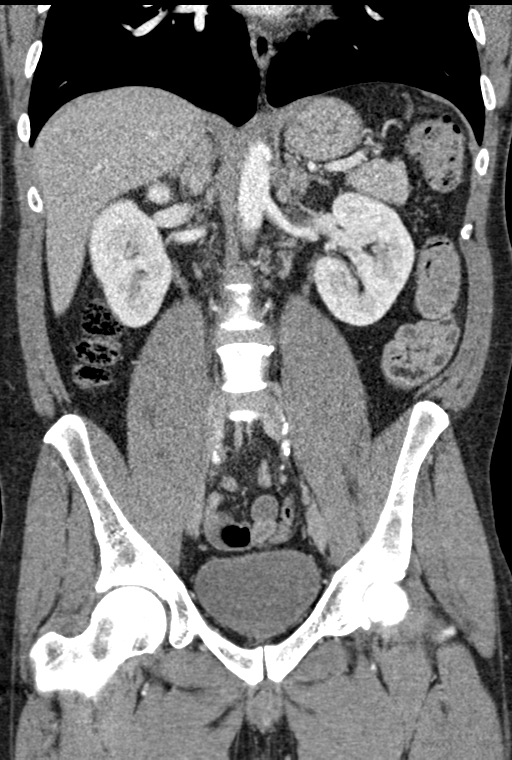

[15 of 46 positions shown; findings below may reference images not displayed]

FINDINGS: Lower chest: No acute pleural or parenchymal lung disease.

Hepatobiliary: No focal liver abnormality is seen. No gallstones,
gallbladder wall thickening, or biliary dilatation.

Pancreas: Unremarkable. No pancreatic ductal dilatation or
surrounding inflammatory changes.

Spleen: Normal in size without focal abnormality.

Adrenals/Urinary Tract: Adrenal glands are unremarkable. Kidneys are
normal, without renal calculi, focal lesion, or hydronephrosis.
Bladder is unremarkable.

Stomach/Bowel: No bowel obstruction or ileus. Normal gas-filled
appendix right lower quadrant.

Vascular/Lymphatic: No pathologic lymphadenopathy within the abdomen
or pelvis. Moderate atherosclerosis at the aortic bifurcation, with
focal high-grade stenosis right common iliac artery estimated
greater than 50%. The left superficial femoral artery is not
identified and may be chronically occluded. The left profundus
femoral is widely patent.

Reproductive: Prostate is unremarkable.

Other: No abdominal wall hernia or abnormality. No abdominopelvic
ascites.

Musculoskeletal: No acute displaced fracture. Reconstructed images
demonstrate no additional findings.
IMPRESSION: 1. No acute intra-abdominal or intrapelvic process. No etiology to
explain the clinical findings of jaundice.
2. Multifocal peripheral vascular disease involving the right common
iliac and left superficial femoral artery. Please correlate with any
symptoms of lower extremity claudication.

## 2021-08-30 ENCOUNTER — Other Ambulatory Visit: Payer: Self-pay | Admitting: Critical Care Medicine

## 2021-08-30 NOTE — Telephone Encounter (Signed)
Medication Refill - Medication: valsartan-hydrochlorothiazide (DIOVAN-HCT) 320-25 MG tablet and amLODipine (NORVASC) 10 MG tablet  Has the patient contacted their pharmacy? Yes.    (Agent: If yes, when and what did the pharmacy advise?) Patient unsure if PCP would prescribe medication to hold him over until next appointment, 10/18/2021  Preferred Pharmacy (with phone number or street name):  [id number] - [place], [place] - [address] AT [facility] OF [address] & [facility] [address] Phone:  [phone]  Fax:  [phone]       Has the patient been seen for an appointment in the last year OR does the patient have an upcoming appointment? Yes.    Agent: Please be advised that RX refills may take up to 3 business days. We ask that you follow-up with your pharmacy.

## 2021-08-31 NOTE — Telephone Encounter (Signed)
Both medications refilled 05/09/2021 #90 with 3 refills. Meds should last until 04/2022. Requested Prescriptions  Pending Prescriptions Disp Refills   valsartan-hydrochlorothiazide (DIOVAN-HCT) 320-25 MG tablet 90 tablet 3    Sig: Take 1 tablet by mouth daily.     Cardiovascular: ARB + Diuretic Combos Failed - 08/30/2021 12:53 PM      Failed - Cr in normal range and within 180 days    Creatinine, Ser  Date Value Ref Range Status  06/19/2021 1.29 (H) 0.76 - 1.27 mg/dL Final         Passed - K in normal range and within 180 days    Potassium  Date Value Ref Range Status  06/19/2021 5.0 3.5 - 5.2 mmol/L Final         Passed - Na in normal range and within 180 days    Sodium  Date Value Ref Range Status  06/19/2021 137 134 - 144 mmol/L Final         Passed - Ca in normal range and within 180 days    Calcium  Date Value Ref Range Status  06/19/2021 9.4 8.7 - 10.2 mg/dL Final         Passed - Patient is not pregnant      Passed - Last BP in normal range    BP Readings from Last 1 Encounters:  06/19/21 112/75         Passed - Valid encounter within last 6 months    Recent Outpatient Visits          2 months ago Essential hypertension   [facility] Drucilla Chalet, RPH-CPP   3 months ago Essential hypertension   [facility] Lois Huxley, Cornelius Moras, RPH-CPP   3 months ago Syphilis   [facility] Storm Frisk, MD   1 year ago Essential hypertension   [facility] Storm Frisk, MD   1 year ago Essential hypertension   [facility] Storm Frisk, MD      Future Appointments            In 1 month Storm Frisk, MD [facility]            amLODipine (NORVASC) 10 MG tablet 90 tablet 3    Sig: Take 1 tablet (10 mg total) by mouth daily.     Cardiovascular:  Calcium Channel  Blockers Passed - 08/30/2021 12:53 PM      Passed - Last BP in normal range    BP Readings from Last 1 Encounters:  06/19/21 112/75         Passed - Valid encounter within last 6 months    Recent Outpatient Visits          2 months ago Essential hypertension   [facility] Lois Huxley, Cornelius Moras, RPH-CPP   3 months ago Essential hypertension   [facility] Lois Huxley, Cornelius Moras, RPH-CPP   3 months ago Syphilis   [facility] Storm Frisk, MD   1 year ago Essential hypertension    [facility] Storm Frisk, MD   1 year ago Essential hypertension    [facility] Storm Frisk, MD      Future Appointments  In 1 month Delford Field Charlcie Cradle, MD [facility]

## 2021-10-18 ENCOUNTER — Encounter: Payer: Self-pay | Admitting: Critical Care Medicine

## 2021-10-18 ENCOUNTER — Other Ambulatory Visit: Payer: Self-pay

## 2021-10-18 ENCOUNTER — Ambulatory Visit: Payer: 59 | Attending: Critical Care Medicine | Admitting: Critical Care Medicine

## 2021-10-18 VITALS — BP 112/74 | HR 84 | Wt 146.8 lb

## 2021-10-18 DIAGNOSIS — I739 Peripheral vascular disease, unspecified: Secondary | ICD-10-CM | POA: Diagnosis not present

## 2021-10-18 DIAGNOSIS — A539 Syphilis, unspecified: Secondary | ICD-10-CM | POA: Diagnosis not present

## 2021-10-18 DIAGNOSIS — I10 Essential (primary) hypertension: Secondary | ICD-10-CM | POA: Diagnosis not present

## 2021-10-18 DIAGNOSIS — Z720 Tobacco use: Secondary | ICD-10-CM | POA: Diagnosis not present

## 2021-10-18 MED ORDER — NICOTINE POLACRILEX 4 MG MT LOZG: LOZENGE | OROMUCOSAL | 4 refills | Status: DC

## 2021-10-18 MED ORDER — VALSARTAN-HYDROCHLOROTHIAZIDE 320-25 MG PO TABS: 1.0000 | ORAL_TABLET | Freq: Every day | ORAL | 3 refills | Status: DC

## 2021-10-18 MED ORDER — PENICILLIN G BENZATHINE 2400000 UNIT/4ML IM SUSP
2.4000 10*6.[IU] | Freq: Once | INTRAMUSCULAR | Status: AC
Start: 2021-10-18 — End: 2021-10-23
  Administered 2021-10-23: 2400000 [IU] via INTRAMUSCULAR

## 2021-10-18 MED ORDER — AMLODIPINE BESYLATE 10 MG PO TABS: 10.0000 mg | ORAL_TABLET | Freq: Every day | ORAL | 3 refills | Status: DC

## 2021-10-18 NOTE — Assessment & Plan Note (Signed)
I am concerned about the possibility of persistent syphilis in this patient with rash persisting his antibody levels remain elevated  Plan to recheck his antibody levels for syphilis at this visit and refer him to infectious disease  We did not have any penicillin G to administer at this visit I will bring him back in for a one-time dose of 2,400,000 units of penicillin G once we achieve supply  He is continuing to avoid sexual intercourse

## 2021-10-18 NOTE — Assessment & Plan Note (Signed)
History of peripheral artery disease that was not followed up I will now refer him to vascular surgery for further evaluations now that he has insurance

## 2021-10-18 NOTE — Progress Notes (Signed)
Established Patient Office Visit  Subjective:  Patient ID: Jesse Mendoza, male    DOB: 03/31/1976  Age: 46 y.o. MRN: [id number]  CC:  Chief Complaint  Patient presents with   Hypertension    HPI Jesse Mendoza presents for follow-up of hypertension and syphilis.  The patient had positive syphilis antibodies at the last visit in September.  He had fading rash over his entire body.  He had an active case in September 21.  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 No pertinent surgical history.  Family History  Problem Relation Age of Onset   Diabetes Mother    Stroke Father     Social History   Socioeconomic History   Marital status: Single    Spouse name: Not on file   Number of children: Not on file   Years of education: Not on file   Highest education level: Not on file  Occupational History   Not on file  Tobacco Use   Smoking status: Every Day    Packs/day: 0.50    Types: Cigarettes, Cigars   Smokeless tobacco: Never  Vaping Use   Vaping Use: Never used  Substance and Sexual Activity   Alcohol use: Not Currently    Comment: about 4 times  a week   Drug use: Yes    Types: Marijuana    Comment: occasionally   Sexual activity: Not on file  Other Topics Concern   Not on file  Social History Narrative   Not on file   Social Determinants of Health   Financial Resource Strain: Not on file  Food Insecurity: Not on file  Transportation Needs: Not on file  Physical Activity: Not on file  Stress: Not on file  Social Connections: Not on file  Intimate Partner Violence: Not on file    Outpatient Medications Prior to Visit  Medication Sig Dispense Refill   amLODipine (NORVASC) 10 MG tablet Take 1 tablet (10 mg total) by mouth daily. 90 tablet 3   nicotine polacrilex (NICORETTE MINI) 4 MG lozenge Use one three times daily to quit smoking 100 tablet 4   valsartan-hydrochlorothiazide (DIOVAN-HCT) 320-25 MG tablet Take 1 tablet by mouth daily. 90 tablet 3   No facility-administered medications prior to visit.    No Known Allergies  ROS Review of Systems  Constitutional: Negative.   HENT: Negative.  Negative for ear pain, postnasal drip, rhinorrhea, sinus pressure, sore throat, trouble swallowing and voice change.   Eyes: Negative.   Respiratory: Negative.  Negative for apnea, cough, choking, chest tightness, shortness of breath, wheezing and stridor.   Cardiovascular: Negative.  Negative for chest pain,  palpitations and leg swelling.  Gastrointestinal: Negative.  Negative for abdominal distention, abdominal pain, nausea and vomiting.  Genitourinary: Negative.   Musculoskeletal: Negative.  Negative for arthralgias and myalgias.  Skin:  Positive for rash.  Allergic/Immunologic: Negative.  Negative for environmental allergies and food allergies.  Neurological: Negative.  Negative for dizziness, syncope, weakness and headaches.  Hematological: Negative.  Negative for adenopathy. Does not bruise/bleed easily.  Psychiatric/Behavioral: Negative.  Negative for agitation and sleep disturbance. The patient is not nervous/anxious.       Objective:    Physical Exam Vitals reviewed.  Constitutional:      Appearance: Normal appearance. He is well-developed. He is not diaphoretic.  HENT:     Head: Normocephalic and atraumatic.     Nose: No nasal deformity, septal deviation, mucosal edema or rhinorrhea.     Right Sinus: No maxillary sinus tenderness or frontal sinus tenderness.     Left Sinus: No maxillary sinus tenderness or frontal sinus tenderness.     Mouth/Throat:     Pharynx: No oropharyngeal exudate.  Eyes:     General: No scleral icterus.    Conjunctiva/sclera: Conjunctivae normal.     Pupils: Pupils are equal, round, and reactive to light.  Neck:     Thyroid: No thyromegaly.     Vascular: No carotid bruit or JVD.     Trachea: Trachea normal. No tracheal tenderness or tracheal deviation.  Cardiovascular:     Rate and Rhythm: Normal rate and regular rhythm.     Chest Wall: PMI is not displaced.     Pulses: Normal pulses. No decreased pulses.     Heart sounds: Normal heart sounds, S1 normal and S2 normal. Heart sounds not distant. No murmur heard. No systolic murmur is present.  No diastolic murmur is present.    No friction rub. No gallop. No S3 or S4 sounds.  Pulmonary:     Effort: No tachypnea, accessory muscle usage or respiratory distress.     Breath sounds: No stridor. No decreased breath sounds, wheezing, rhonchi or rales.  Chest:     Chest wall: No tenderness.  Abdominal:     General: Bowel sounds are normal. There is no distension.     Palpations: Abdomen is soft. Abdomen is not rigid.     Tenderness: There is no abdominal tenderness. There is no guarding or rebound.  Musculoskeletal:        General: Normal range of motion.     Cervical back: Normal range of motion and neck supple. No edema, erythema or rigidity. No muscular tenderness. Normal range of motion.  Lymphadenopathy:     Head:     Right side of head: No submental or submandibular adenopathy.     Left side of head: No submental  or submandibular adenopathy.     Cervical: No cervical adenopathy.  Skin:    General: Skin is warm and dry.     Coloration: Skin is not pale.     Findings: Rash present.     Nails: There is no clubbing.     Comments: There is a diffuse circular faded rash on posterior anterior chest soles of the feet and lower extremities and arms consistent with prior syphilitic involvement  His previous antibody levels were greater than 1-1    Neurological:     Mental Status: He is alert and oriented to person, place, and time.     Sensory: No sensory deficit.  Psychiatric:        Speech: Speech normal.  Behavior: Behavior normal.    BP 112/74    Pulse 84    Wt 146 lb 12.8 oz (66.6 kg)    SpO2 100%    BMI 24.43 kg/m  Wt Readings from Last 3 Encounters:  10/18/21 146 lb 12.8 oz (66.6 kg)  05/09/21 157 lb 3.2 oz (71.3 kg)  01/05/20 161 lb 3.2 oz (73.1 kg)     Health Maintenance Due  Topic Date Due   COVID-19 Vaccine (3 - Booster) 05/04/2020   TETANUS/TDAP  01/08/2021    There are no preventive care reminders to display for this patient.  Lab Results  Component Value Date   TSH 1.910 05/09/2021   Lab Results  Component Value Date   WBC 9.0 05/09/2021   HGB 15.9 05/09/2021   HCT 46.0 05/09/2021   MCV 103 (H) 05/09/2021   PLT 243 05/09/2021   Lab Results  Component Value Date   NA 137 06/19/2021   K 5.0 06/19/2021   CO2 23 06/19/2021   GLUCOSE 95 06/19/2021   BUN 19 06/19/2021   CREATININE 1.29 (H) 06/19/2021   BILITOT <0.2 11/17/2019   ALKPHOS 128 (H) 11/17/2019   AST 18 11/17/2019   ALT 24 11/17/2019   PROT 6.6 11/17/2019   ALBUMIN 4.0 11/17/2019   CALCIUM 9.4 06/19/2021   ANIONGAP 8 09/29/2019   EGFR 71 06/19/2021   Lab Results  Component Value Date   CHOL 203 (H) 01/18/2009   Lab Results  Component Value Date   HDL 41.00 01/18/2009   No results found for: [facility] Lab Results  Component Value Date   TRIG 58.0 01/18/2009   Lab Results  Component  Value Date   CHOLHDL 5 01/18/2009   No results found for: HGBA1C    Assessment & Plan:   Problem List Items Addressed This Visit       Cardiovascular and Mediastinum   Essential hypertension    Blood pressure under excellent control we will follow-up lab studies and continue valsartan HCT and amlodipine as prescribed and continue to pursue smoking cessation      Relevant Medications   amLODipine (NORVASC) 10 MG tablet   valsartan-hydrochlorothiazide (DIOVAN-HCT) 320-25 MG tablet   Peripheral vascular disease ([place])    History of peripheral artery disease that was not followed up I will now refer him to vascular surgery for further evaluations now that he has insurance      Relevant Medications   amLODipine (NORVASC) 10 MG tablet   valsartan-hydrochlorothiazide (DIOVAN-HCT) 320-25 MG tablet   Other Relevant Orders   Lipid panel   Comprehensive metabolic panel   Ambulatory referral to Vascular Surgery     Other   Syphilis - Primary    I am concerned about the possibility of persistent syphilis in this patient with rash persisting his antibody levels remain elevated  Plan to recheck his antibody levels for syphilis at this visit and refer him to infectious disease  We did not have any penicillin G to administer at this visit I will bring him back in for a one-time dose of 2,400,000 units of penicillin G once we achieve supply  He is continuing to avoid sexual intercourse      Relevant Medications   Penicillin G Benzathine SUSP 2,400,000 Units   Other Relevant Orders   Fluorescent treponemal ab(fta)-IgG-bld   CBC with Differential/Platelet   Ambulatory referral to Infectious Disease   Tobacco use       Current smoking consumption amount: 3 cigars daily  Dicsussion  on advise to quit smoking and smoking impacts: Cardiovascular impact  Patient's willingness to quit: Willing to quit  Methods to quit smoking discussed: Nicotine replacement with lozenge 4 mg 3 times  daily and behavioral modification  Medication management of smoking session drugs discussed: Nicotine lozenge  Resources provided:  AVS   Setting quit date not established  Follow-up arranged 2 months   Time spent counseling the patient: 5 minutes         Meds ordered this encounter  Medications   nicotine polacrilex (NICORETTE MINI) 4 MG lozenge    Sig: Use one three times daily to quit smoking    Dispense:  100 tablet    Refill:  4   Penicillin G Benzathine SUSP 2,400,000 Units   amLODipine (NORVASC) 10 MG tablet    Sig: Take 1 tablet (10 mg total) by mouth daily.    Dispense:  90 tablet    Refill:  3   valsartan-hydrochlorothiazide (DIOVAN-HCT) 320-25 MG tablet    Sig: Take 1 tablet by mouth daily.    Dispense:  90 tablet    Refill:  3    Follow-up: Return in about 3 months (around 01/15/2022).    Asencion Noble, MD

## 2021-10-18 NOTE — Assessment & Plan Note (Signed)
Blood pressure under excellent control we will follow-up lab studies and continue valsartan HCT and amlodipine as prescribed and continue to pursue smoking cessation

## 2021-10-18 NOTE — Assessment & Plan Note (Signed)
° ° °•   Current smoking consumption amount: 3 cigars daily  . Dicsussion on advise to quit smoking and smoking impacts: Cardiovascular impact  . Patient's willingness to quit: Willing to quit  . Methods to quit smoking discussed: Nicotine replacement with lozenge 4 mg 3 times daily and behavioral modification  . Medication management of smoking session drugs discussed: Nicotine lozenge  . Resources provided:  AVS   . Setting quit date not established  . Follow-up arranged 2 months   Time spent counseling the patient: 5 minutes   

## 2021-10-18 NOTE — Patient Instructions (Addendum)
Labs: syphilis antibodies, lipid panel, metabolic , blood counts  Penicillin G given today  Referrals to infectious disease and vascular surgery made  Stop smoking , use nicotine lozenges  A nurse visit for Tetanus shot in two weeks will be made  Return Dr Delford Field 3 months  Refills on medications sent to your [facility] pharmacy

## 2021-10-20 ENCOUNTER — Telehealth: Payer: Self-pay

## 2021-10-20 ENCOUNTER — Ambulatory Visit: Payer: Self-pay | Admitting: *Deleted

## 2021-10-20 DIAGNOSIS — A539 Syphilis, unspecified: Secondary | ICD-10-CM

## 2021-10-20 LAB — COMPREHENSIVE METABOLIC PANEL
ALT: 13 IU/L (ref 0–44)
AST: 15 IU/L (ref 0–40)
Albumin/Globulin Ratio: 1.6 (ref 1.2–2.2)
Albumin: 4.5 g/dL (ref 4.0–5.0)
Alkaline Phosphatase: 143 IU/L — ABNORMAL HIGH (ref 44–121)
BUN/Creatinine Ratio: 15 (ref 9–20)
BUN: 22 mg/dL (ref 6–24)
Bilirubin Total: 0.3 mg/dL (ref 0.0–1.2)
CO2: 20 mmol/L (ref 20–29)
Calcium: 9.5 mg/dL (ref 8.7–10.2)
Chloride: 99 mmol/L (ref 96–106)
Creatinine, Ser: 1.44 mg/dL — ABNORMAL HIGH (ref 0.76–1.27)
Globulin, Total: 2.8 g/dL (ref 1.5–4.5)
Glucose: 89 mg/dL (ref 70–99)
Potassium: 4.5 mmol/L (ref 3.5–5.2)
Sodium: 132 mmol/L — ABNORMAL LOW (ref 134–144)
Total Protein: 7.3 g/dL (ref 6.0–8.5)
eGFR: 62 mL/min/{1.73_m2} (ref >59)

## 2021-10-20 LAB — LIPID PANEL
Chol/HDL Ratio: 8 ratio — ABNORMAL HIGH (ref 0.0–5.0)
Cholesterol, Total: 215 mg/dL — ABNORMAL HIGH (ref 100–199)
HDL: 27 mg/dL — ABNORMAL LOW (ref >39)
LDL Chol Calc (NIH): 163 mg/dL — ABNORMAL HIGH (ref 0–99)
Triglycerides: 137 mg/dL (ref 0–149)
VLDL Cholesterol Cal: 25 mg/dL (ref 5–40)

## 2021-10-20 LAB — CBC WITH DIFFERENTIAL/PLATELET
Basophils Absolute: 0 10*3/uL (ref 0.0–0.2)
Basos: 0 %
EOS (ABSOLUTE): 0.2 10*3/uL (ref 0.0–0.4)
Eos: 2 %
Hematocrit: 33.8 % — ABNORMAL LOW (ref 37.5–51.0)
Hemoglobin: 11.8 g/dL — ABNORMAL LOW (ref 13.0–17.7)
Immature Grans (Abs): 0 10*3/uL (ref 0.0–0.1)
Immature Granulocytes: 0 %
Lymphocytes Absolute: 2.8 10*3/uL (ref 0.7–3.1)
Lymphs: 33 %
MCH: 36.2 pg — ABNORMAL HIGH (ref 26.6–33.0)
MCHC: 34.9 g/dL (ref 31.5–35.7)
MCV: 104 fL — ABNORMAL HIGH (ref 79–97)
Monocytes Absolute: 0.8 10*3/uL (ref 0.1–0.9)
Monocytes: 10 %
Neutrophils Absolute: 4.5 10*3/uL (ref 1.4–7.0)
Neutrophils: 55 %
Platelets: 312 10*3/uL (ref 150–450)
RBC: 3.26 x10E6/uL — ABNORMAL LOW (ref 4.14–5.80)
RDW: 11.4 % — ABNORMAL LOW (ref 11.6–15.4)
WBC: 8.4 10*3/uL (ref 3.4–10.8)

## 2021-10-20 LAB — FLUORESCENT TREPONEMAL AB(FTA)-IGG-BLD: Fluorescent Treponemal Ab, IgG: REACTIVE — AB

## 2021-10-20 NOTE — Telephone Encounter (Signed)
-----   Message from Elsie Stain, MD sent at 10/20/2021  7:16 AM EST ----- Let pt know he still has syphilis antibodies   pls make sure he comes in for penicillin injections  needs 2.4 million pcn G IM   Cholesterol is high, follow healthy diet as we discussed, kidnye and liver normal Blood counts normal

## 2021-10-20 NOTE — Telephone Encounter (Signed)
Elfredia Nevins called in but the line disconnected or he hung up before he was connected to me.  I called back to the number the agent said he called from.  [phone].  Denyse Amass with  [employer] of Division of Health for public health.   He is a Medical laboratory scientific officer.  It's with Division of Health and Human Services per another man that answered the phone when I called back.  It's about an RPR.  [phone] is where Denyse Amass can be reached.  Someone with [employer] and [facility]ness needs to call regarding this pt and Denyse Amass needing information.    I sent a message to [facility] with the name and contact number for them to return the call to Elfredia Nevins at [phone].

## 2021-10-20 NOTE — Telephone Encounter (Signed)
I called Mr. Jesse Mendoza they are requesting for him to get another RPR done, I did let him know that you are out of the office. Which he said is fine and that he will give me call Tuesday, orders can be done then  (My number was left with him.)

## 2021-10-20 NOTE — Telephone Encounter (Signed)
Pt was called and is aware of results, DOB was confirmed.  Appointment was made

## 2021-10-23 ENCOUNTER — Telehealth: Payer: Self-pay | Admitting: Critical Care Medicine

## 2021-10-23 ENCOUNTER — Ambulatory Visit: Payer: 59 | Attending: Critical Care Medicine

## 2021-10-23 ENCOUNTER — Other Ambulatory Visit: Payer: Self-pay | Admitting: Critical Care Medicine

## 2021-10-23 DIAGNOSIS — A539 Syphilis, unspecified: Secondary | ICD-10-CM

## 2021-10-23 NOTE — Telephone Encounter (Signed)
Called Mr. Jesse Mendoza back, pt appointment was scheduled

## 2021-10-23 NOTE — Progress Notes (Signed)
Pt arrived for Penicillin injection Pt given injection in right ventrogluteal. Pt tolerated injection well.

## 2021-10-23 NOTE — Telephone Encounter (Signed)
When pt come in for penicillin injection have him obtain another RPR antibody test, I ordered this

## 2021-10-23 NOTE — Telephone Encounter (Signed)
Called pt and appointment was made 

## 2021-10-23 NOTE — Telephone Encounter (Signed)
Cory from [employer] returning call to [place] about pt std  test

## 2021-10-23 NOTE — Addendum Note (Signed)
Addended by: Storm Frisk on: 10/23/2021 06:28 AM   Modules accepted: Orders

## 2021-10-24 ENCOUNTER — Other Ambulatory Visit: Payer: Self-pay

## 2021-10-24 ENCOUNTER — Ambulatory Visit: Payer: 59 | Attending: Critical Care Medicine

## 2021-10-24 DIAGNOSIS — A539 Syphilis, unspecified: Secondary | ICD-10-CM

## 2021-10-26 ENCOUNTER — Telehealth: Payer: Self-pay

## 2021-10-26 LAB — RPR, QUANT: RPR, Quant: 1:1 {titer} — ABNORMAL HIGH

## 2021-10-26 LAB — RPR QUALITATIVE: RPR Ser Ql: NONREACTIVE

## 2021-10-26 LAB — RPR W/REFLEX TO TREPSURE: RPR: REACTIVE — AB

## 2021-10-26 NOTE — Progress Notes (Signed)
Let. Pt know he still has high RPR antibodies suggesting active syphilis. I trust he recieved his PCN injection share labs with health dept

## 2021-10-26 NOTE — Telephone Encounter (Signed)
-----   Message from Elsie Stain, MD sent at 10/26/2021  9:25 AM EST ----- ?Let. Pt know he still has high RPR antibodies suggesting active syphilis. I trust he recieved his PCN injection share labs with health dept ?

## 2021-10-26 NOTE — Telephone Encounter (Signed)
Pt was called and is aware of results, DOB was confirmed.  ?

## 2021-11-02 ENCOUNTER — Ambulatory Visit (INDEPENDENT_AMBULATORY_CARE_PROVIDER_SITE_OTHER): Payer: 59 | Admitting: Internal Medicine

## 2021-11-02 ENCOUNTER — Other Ambulatory Visit: Payer: Self-pay

## 2021-11-02 VITALS — BP 107/73 | HR 99 | Resp 16 | Ht 65.0 in | Wt 148.0 lb

## 2021-11-02 DIAGNOSIS — Z8619 Personal history of other infectious and parasitic diseases: Secondary | ICD-10-CM

## 2021-11-02 DIAGNOSIS — Z113 Encounter for screening for infections with a predominantly sexual mode of transmission: Secondary | ICD-10-CM | POA: Diagnosis not present

## 2021-11-02 NOTE — Patient Instructions (Signed)
You were well treated for syphilis ? ?The rash you have now is just pigmented changes which unlikely will go away ? ? ?No need to repeat syphilis testing unless you think you have new exposure ? ?We are retesting hiv and hepatitis today though -- you can see result on mychart. Send [place] any question if need to. If tests abnormal will contact you ? ?No need to follow up at id clinic at this time ?

## 2021-11-02 NOTE — Progress Notes (Signed)
?  ? ? ? ? ?[facility] ? ?Patient Active Problem List  ? Diagnosis Date Noted  ? Peripheral vascular disease ([place]) 10/18/2021  ? Tobacco use 01/05/2020  ? Syphilis 11/17/2019  ? Essential hypertension 11/17/2019  ? ERECTILE DYSFUNCTION 01/18/2009  ? ? ? ? ?Subjective:  ? ? Patient ID: Jesse Mendoza, male    DOB: December 12, 1975, 46 y.o.   MRN: [id number] ? ?Chief Complaint  ?Patient presents with  ? Establish Care  ?  Syphillis - Treated over a year ago with pcn injection (one) at ED then last week was given a second injection of PCN. Following up to see if treatment is complete.   ? ?Cc: follow up for syphilis ? ? ?HPI: ? ?Jesse Mendoza is a 46 y.o. male referred here by pcp for syphilis ? ?First dx'ed syphilis around spring 2021. No prior syphilis testing. Had a rash (raised/squamous-scaling). Went to ER and got 1 shot in buttock. Titer was 8 ? ?He had repeat titer in 04/2021 and 09/2021 and titers remain at 1. He saw his pcp a week prior to this visit and was given 1 more shot of benzathine pcn. ? ?He still have the rash same distribution; some had gone away in 2021 with treatment. No symptoms. ? ?Patient haven't had sexual intercourse ? ?No fever, chill, joint pain ?Good appetite ?No weight loss ?No penile discharge ? ?Last hiv screen was in 2021 negative, along with hepatitis testing. ?Patient is heterosexual ? ?No Known Allergies ? ? ? ?Outpatient Medications Prior to Visit  ?Medication Sig Dispense Refill  ? amLODipine (NORVASC) 10 MG tablet Take 1 tablet (10 mg total) by mouth daily. 90 tablet 3  ? valsartan-hydrochlorothiazide (DIOVAN-HCT) 320-25 MG tablet Take 1 tablet by mouth daily. 90 tablet 3  ? nicotine polacrilex (NICORETTE MINI) 4 MG lozenge Use one three times daily to quit smoking (Patient not taking: Reported on 11/02/2021) 100 tablet 4  ? ?No facility-administered medications prior to visit.  ? ? ? ?Social History  ? ?Socioeconomic History  ? Marital status: Single  ?  Spouse  name: Not on file  ? Number of children: Not on file  ? Years of education: Not on file  ? Highest education level: Not on file  ?Occupational History  ? Not on file  ?Tobacco Use  ? Smoking status: Every Day  ?  Packs/day: 0.50  ?  Types: Cigarettes, Cigars  ? Smokeless tobacco: Never  ?Vaping Use  ? Vaping Use: Never used  ?Substance and Sexual Activity  ? Alcohol use: Not Currently  ?  Comment: about 4 times a week  ? Drug use: Yes  ?  Types: Marijuana  ?  Comment: occasionally  ? Sexual activity: Not on file  ?Other Topics Concern  ? Not on file  ?Social History Narrative  ? Not on file  ? ?Social Determinants of Health  ? ?Financial Resource Strain: Not on file  ?Food Insecurity: Not on file  ?Transportation Needs: Not on file  ?Physical Activity: Not on file  ?Stress: Not on file  ?Social Connections: Not on file  ?Intimate Partner Violence: Not on file  ? ? ? ? ?Review of Systems ?   ?All other ros negative ? ? ?Objective:  ?  ?BP 107/73   Pulse 99   Resp 16   Ht 5' 5"  (1.651 m)   Wt 148 lb (67.1 kg)   SpO2 99%   BMI 24.63 kg/m?  ?Nursing note and vital  signs reviewed. ? ?Physical Exam ? ?   ?General/constitutional: no distress, pleasant ?HEENT: Normocephalic, PER, Conj Clear, EOMI, Oropharynx clear ?Neck supple ?CV: rrr no mrg ?Lungs: clear to auscultation, normal respiratory effort ?Abd: Soft, Nontender ?Ext: no edema ?Skin: hyperpigmented chronic scar see picture ?Neuro: nonfocal ?MSK: no peripheral joint swelling/tenderness/warmth; back spines nontender ? ? ? ?Labs: ?Lab Results  ?Component Value Date  ? WBC 8.4 10/18/2021  ? HGB 11.8 (L) 10/18/2021  ? HCT 33.8 (L) 10/18/2021  ? MCV 104 (H) 10/18/2021  ? PLT 312 10/18/2021  ? ?Last metabolic panel ?Lab Results  ?Component Value Date  ? GLUCOSE 89 10/18/2021  ? NA 132 (L) 10/18/2021  ? K 4.5 10/18/2021  ? CL 99 10/18/2021  ? CO2 20 10/18/2021  ? BUN 22 10/18/2021  ? CREATININE 1.44 (H) 10/18/2021  ? EGFR 62 10/18/2021  ? CALCIUM 9.5 10/18/2021  ?  PROT 7.3 10/18/2021  ? ALBUMIN 4.5 10/18/2021  ? LABGLOB 2.8 10/18/2021  ? AGRATIO 1.6 10/18/2021  ? BILITOT 0.3 10/18/2021  ? ALKPHOS 143 (H) 10/18/2021  ? AST 15 10/18/2021  ? ALT 13 10/18/2021  ? ANIONGAP 8 09/29/2019  ? ? ?Micro: ? ?Serology: ? ?Imaging: ? ?Assessment & Plan:  ? ?Problem List Items Addressed This Visit   ?None ?Visit Diagnoses   ? ? History of syphilis    -  Primary  ? ?  ? ?Discussed natural history/pathogenesis/diagnosis of syphilis with and without treatment ?It appears he was treated for primary/secondary syphilis in 2021. There has been no further sexual exposure since then. The titer showed good response from 8 --> 1 over one year and remained stead-fast at 1. The rash he showed today appeared to be hyperpigmented scar from initial rash from 2021 and not new.  ? ?He was given another pcn shot a week prior to today 11/02/2021 which is not harmful or necessary. Likely given due to unfamiliarity with rash appearance and history of rpr titer evolution ? ?He doesn't need to have rpr titer repeat any more unless new exposure  ? ?He doesn't think he is high risk sexually so denies prEp ? ?Other thing to do today is to repeat hiv and hepatitis screen ? ? ? ?Follow-up: No follow-ups on file. ? ? ?I spent 60 minute reviewing data/chart, and coordinating care and >50% direct face to face time providing counseling/discussing diagnostics/treatment plan with patient ? ? ? ?Jabier Mutton, MD ?[facility] ?[place] ?[phone]  pager   [phone] cell ?11/02/2021, 10:35 AM ? ?

## 2021-11-03 LAB — HEPATITIS PANEL, ACUTE
Hep A IgM: NONREACTIVE
Hep B C IgM: NONREACTIVE
Hepatitis B Surface Ag: NONREACTIVE
Hepatitis C Ab: NONREACTIVE
SIGNAL TO CUT-OFF: 0.06 (ref <1.00)

## 2021-11-03 LAB — HIV ANTIBODY (ROUTINE TESTING W REFLEX): HIV 1&2 Ab, 4th Generation: NONREACTIVE

## 2021-11-28 ENCOUNTER — Other Ambulatory Visit: Payer: Self-pay

## 2021-11-28 DIAGNOSIS — I739 Peripheral vascular disease, unspecified: Secondary | ICD-10-CM

## 2021-12-12 ENCOUNTER — Ambulatory Visit (INDEPENDENT_AMBULATORY_CARE_PROVIDER_SITE_OTHER): Payer: 59 | Admitting: Vascular Surgery

## 2021-12-12 ENCOUNTER — Ambulatory Visit (HOSPITAL_COMMUNITY)
Admission: RE | Admit: 2021-12-12 | Discharge: 2021-12-12 | Disposition: A | Discharge status: Home / Self Care | Payer: 59 | Source: Ambulatory Visit | Attending: Vascular Surgery | Admitting: Vascular Surgery

## 2021-12-12 ENCOUNTER — Encounter: Payer: Self-pay | Admitting: Vascular Surgery

## 2021-12-12 VITALS — BP 108/68 | HR 83 | Temp 99.2°F | Resp 20 | Ht 65.0 in | Wt 151.0 lb

## 2021-12-12 DIAGNOSIS — I70213 Atherosclerosis of native arteries of extremities with intermittent claudication, bilateral legs: Secondary | ICD-10-CM | POA: Diagnosis not present

## 2021-12-12 DIAGNOSIS — I739 Peripheral vascular disease, unspecified: Secondary | ICD-10-CM | POA: Insufficient documentation

## 2021-12-12 NOTE — Progress Notes (Signed)
[facility] [place] ? ?ASSESSMENT / PLAN: ?Jesse Mendoza is a 46 y.o. 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 ? ?Patient counseled patients with asymptomatic peripheral arterial disease or claudication have a 1-2% risk of developing chronic limb threatening ischemia, but a 15-30% risk of mortality in the next 5 years. Intervention should only be considered for medically optimized patients with disabling symptoms.  ? ?Recommend the following which can slow the progression of atherosclerosis and reduce the risk of major adverse cardiac / limb events:  ?Complete cessation from all tobacco products. ?Blood glucose control with goal A1c < 7%. ?Blood pressure control with goal blood pressure < 140/90 mmHg. ?Lipid reduction therapy with goal LDL-C <100 mg/dL (<11 if symptomatic from PAD).  ?Aspirin 81mg  PO QD.  ?Atorvastatin 40-80mg  PO QD (or other "high intensity" statin therapy). ?Daily walking to and past the point of discomfort. Patient counseled to keep a log of exercise distance. ? ?Follow-up with me in 1 year.  Sooner should symptoms of ischemic rest pain or ischemic ulceration develop. ? ?CHIEF COMPLAINT: Incidental discovery of atherosclerosis on CT scan ? ?HISTORY OF PRESENT ILLNESS: ?Jesse Mendoza is a 46 y.o. male referred to clinic for evaluation of peripheral arterial disease.  The patient underwent a CT scan of his abdomen pelvis for an unrelated problem, which demonstrated aortoiliac closely disease with significant right common iliac artery stenosis, and left superficial femoral artery occlusion.  The patient reports fairly classic symptoms of claudication.  He is able to attend to his activities of independent living without much difficulty.  He is able to mow his yard.  He does notice some typical cramping symptoms when walking for a while.  He does not have any symptoms of limb threatening ischemia including  ischemic rest pain or ulceration about the feet. ? ?VASCULAR SURGICAL HISTORY: none ? ?VASCULAR RISK FACTORS: ?Negative history of stroke / transient ischemic attack. ?Negative history of coronary artery disease.  ?Negative history of diabetes mellitus.  ?Positive history of smoking. + actively smoking. ?Positive history of hypertension.  ?Negative history of chronic kidney disease.   ?Negative history of chronic obstructive pulmonary disease. ? ?FUNCTIONAL STATUS: ?ECOG performance status: (0) Fully active, able to carry on all predisease performance without restriction ?Ambulatory status: Ambulatory within the community with limits ? ?Past Medical History:  ?Diagnosis Date  ? 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 Hypertension   ? ? ?History reviewed. No pertinent surgical history. ? ?Family History  ?Problem Relation Age of Onset  ? Diabetes Mother   ? Stroke Father   ? ? ?Social History  ? ?Socioeconomic History  ? Marital status: Single  ?  Spouse name: Not on file  ? Number of children: Not on file  ? Years of education: Not on file  ? Highest education level: Not on file  ?Occupational History  ? Not on file  ?Tobacco Use  ? Smoking status: Every Day  ?  Packs/day: 0.50  ?  Types: Cigarettes, Cigars  ? Smokeless tobacco: Never  ?Vaping Use  ? Vaping Use: Never used  ?Substance and Sexual Activity  ? Alcohol use: Not Currently  ?  Comment: about 4 times a week  ? Drug use: Yes  ?  Types: Marijuana  ?  Comment: occasionally  ? Sexual activity: Not on file  ?Other Topics Concern  ? Not on file  ?Social History Narrative  ? Not on file  ? ?Social  Determinants of Health  ? ?Financial Resource Strain: Not on file  ?Food Insecurity: Not on file  ?Transportation Needs: Not on file  ?Physical Activity: Not on file  ?Stress: Not on file  ?Social Connections: Not on file  ?Intimate Partner Violence: Not on file  ? ? ?No Known Allergies ? ?Current Outpatient Medications   ?Medication Sig Dispense Refill  ? amLODipine (NORVASC) 10 MG tablet Take 1 tablet (10 mg total) by mouth daily. 90 tablet 3  ? valsartan-hydrochlorothiazide (DIOVAN-HCT) 320-25 MG tablet Take 1 tablet by mouth daily. 90 tablet 3  ? ?No current facility-administered medications for this visit.  ? ? ?PHYSICAL EXAM ?Vitals:  ? 12/12/21 0836  ?BP: 108/68  ?Pulse: 83  ?Resp: 20  ?Temp: 99.2 ?F (37.3 ?C)  ?SpO2: 100%  ?Weight: 151 lb (68.5 kg)  ?Height: 5\' 5"  (1.651 m)  ? ? ?Constitutional: Appearing young man in no acute distress. ?Cardiac: regular rate and rhythm.  ?Respiratory:  unlabored. ?Abdominal:  soft, non-tender, non-distended.  ?Peripheral vascular: Palpable pedal pulses.  No ulceration about the feet. ? ?PERTINENT LABORATORY AND RADIOLOGIC DATA ? ?Most recent CBC ? ?  Latest Ref Rng & Units 10/18/2021  ? 11:25 AM 05/09/2021  ? 11:38 AM 11/17/2019  ?  4:19 PM  ?CBC  ?WBC 3.4 - 10.8 x10E3/uL 8.4   9.0   8.0    ?Hemoglobin 13.0 - 17.7 g/dL 16.111.8   09.615.9   04.514.9    ?Hematocrit 37.5 - 51.0 % 33.8   46.0   43.6    ?Platelets 150 - 450 x10E3/uL 312   243   272    ?  ? ?Most recent CMP ? ?  Latest Ref Rng & Units 10/18/2021  ? 11:25 AM 06/19/2021  ? 10:08 AM 05/22/2021  ? 10:56 AM  ?CMP  ?Glucose 70 - 99 mg/dL 89   95   97    ?BUN 6 - 24 mg/dL 22   19   29     ?Creatinine 0.76 - 1.27 mg/dL 4.091.44   8.111.29   9.141.40    ?Sodium 134 - 144 mmol/L 132   137   132    ?Potassium 3.5 - 5.2 mmol/L 4.5   5.0   4.5    ?Chloride 96 - 106 mmol/L 99   100   97    ?CO2 20 - 29 mmol/L 20   23   21     ?Calcium 8.7 - 10.2 mg/dL 9.5   9.4   9.7    ?Total Protein 6.0 - 8.5 g/dL 7.3      ?Total Bilirubin 0.0 - 1.2 mg/dL 0.3      ?Alkaline Phos 44 - 121 IU/L 143      ?AST 0 - 40 IU/L 15      ?ALT 0 - 44 IU/L 13      ? ? ?Renal function ?CrCl cannot be calculated (Patient's most recent lab result is older than the maximum 21 days allowed.). ? ?No results found for: HGBA1C ? ?LDL Chol Calc (NIH)  ?Date Value Ref Range Status  ?10/18/2021 163 (H) 0 - 99  mg/dL Final  ? ?Direct LDL  ?Date Value Ref Range Status  ?01/18/2009 157.2 mg/dL Final  ?  Comment:  ?  See lab report for associated comment(s)  ?  ?+-------+-----------+-----------+------------+------------+  ?ABI/TBIToday's ABIToday's TBIPrevious ABIPrevious TBI  ?+-------+-----------+-----------+------------+------------+  ?Right  0.65       0.21                                 ?+-------+-----------+-----------+------------+------------+  ?  Left   0.66       0.30                                 ?+-------+-----------+-----------+------------+------------+  ? ?Rande Brunt. Lenell Antu, MD ?[facility] [place] ?Office Phone Number: [phone] ?12/12/2021 10:59 AM ? ?Total time spent on preparing this encounter including chart review, data review, collecting history, examining the patient, coordinating care for this new patient, 60 minutes. ? ?Portions of this report may have been transcribed using voice recognition software.  Every effort has been made to ensure accuracy; however, inadvertent computerized transcription errors may still be present. ? ? ? ?

## 2022-05-06 ENCOUNTER — Other Ambulatory Visit: Payer: Self-pay | Admitting: Critical Care Medicine

## 2022-05-19 ENCOUNTER — Other Ambulatory Visit: Payer: Self-pay | Admitting: Critical Care Medicine

## 2022-05-21 NOTE — Telephone Encounter (Signed)
Refilled 10/18/2021 #[id number] rf. Requested Prescriptions  Pending Prescriptions Disp Refills  . amLODipine (NORVASC) 10 MG tablet [Pharmacy Med Name: AMLODIPINE BESYLATE 10MG  TABLETS] 90 tablet 3    Sig: TAKE 1 TABLET(10 MG) BY MOUTH DAILY     Cardiovascular: Calcium Channel Blockers 2 Failed - 05/19/2022 11:26 AM      Failed - Valid encounter within last 6 months    Recent Outpatient Visits          7 months ago [place] Elsie Stain, MD   11 months ago Essential hypertension   [place], Jarome Matin, RPH-CPP   12 months ago Essential hypertension   [place], Stephen L, RPH-CPP   1 year ago [place] Elsie Stain, MD   2 years ago Essential hypertension   [place], MD             Passed - Last BP in normal range    BP Readings from Last 1 Encounters:  12/12/21 108/68         Passed - Last Heart Rate in normal range    Pulse Readings from Last 1 Encounters:  12/12/21 83

## 2022-10-01 ENCOUNTER — Ambulatory Visit: Payer: Self-pay

## 2022-10-01 NOTE — Telephone Encounter (Signed)
  Chief Complaint: chest pain  Symptoms: on and off chest pain, L sided, 2-3/10 Frequency: 1 week  Pertinent Negatives: Patient denies dizziness, N/V Disposition: [] ED /[x] Urgent Care (no appt availability in office) / [x] Appointment(In office/virtual)/ []  [facility]/ [] Home Care/ [] Refused Recommended Disposition /[] [facility]/ []  Follow-up with PCP Additional Notes: pt states pain comes and goes but has been more frequent this week. Scheduled OV for 10/30/21 at 0930 with Levada Dy, PA and also scheduled UC appt tomorrow at 0930. Pt wants to keep OV for now to have as FU.   Reason for Disposition  [1] Chest pain lasts < 5 minutes AND [2] NO chest pain or cardiac symptoms (e.g., breathing difficulty, sweating) now  (Exception: Chest pains that last only a few seconds.)  Answer Assessment - Initial Assessment Questions 1. LOCATION: "Where does it hurt?"       In chest L sided  2. RADIATION: "Does the pain go anywhere else?" (e.g., into neck, jaw, arms, back)     no 3. ONSET: "When did the chest pain begin?" (Minutes, hours or days)      1 week  4. PATTERN: "Does the pain come and go, or has it been constant since it started?"  "Does it get worse with exertion?"      Comes and goes, getting more frequent  6. SEVERITY: "How bad is the pain?"  (e.g., Scale 1-10; mild, moderate, or severe)    - MILD (1-3): doesn't interfere with normal activities     - MODERATE (4-7): interferes with normal activities or awakens from sleep    - SEVERE (8-10): excruciating pain, unable to do any normal activities       2-3/10 7. CARDIAC RISK FACTORS: "Do you have any history of heart problems or risk factors for heart disease?" (e.g., angina, prior heart attack; diabetes, high blood pressure, high cholesterol, smoker, or strong family history of heart disease)     HTN, stopped BP meds in Sept  10. OTHER SYMPTOMS: "Do you have any other symptoms?" (e.g., dizziness, nausea, vomiting, sweating,  fever, difficulty breathing, cough)       Slight sweating  Protocols used: Chest Pain-A-AH /

## 2022-10-02 ENCOUNTER — Emergency Department (HOSPITAL_COMMUNITY)
Admission: EM | Admit: 2022-10-02 | Discharge: 2022-10-02 | Disposition: A | Discharge status: Home / Self Care | Payer: 59 | Attending: Emergency Medicine | Admitting: Emergency Medicine

## 2022-10-02 ENCOUNTER — Emergency Department (HOSPITAL_COMMUNITY): Payer: 59

## 2022-10-02 ENCOUNTER — Encounter (HOSPITAL_COMMUNITY): Payer: Self-pay

## 2022-10-02 ENCOUNTER — Other Ambulatory Visit: Payer: Self-pay

## 2022-10-02 ENCOUNTER — Ambulatory Visit
Admission: RE | Admit: 2022-10-02 | Discharge: 2022-10-02 | Disposition: A | Discharge status: Other Acute Inpatient Hospital | Payer: 59 | Source: Ambulatory Visit | Attending: Internal Medicine | Admitting: Internal Medicine

## 2022-10-02 VITALS — BP 184/115 | HR 99 | Temp 98.8°F | Resp 18

## 2022-10-02 DIAGNOSIS — R0789 Other chest pain: Secondary | ICD-10-CM

## 2022-10-02 DIAGNOSIS — I10 Essential (primary) hypertension: Secondary | ICD-10-CM | POA: Insufficient documentation

## 2022-10-02 DIAGNOSIS — R079 Chest pain, unspecified: Secondary | ICD-10-CM | POA: Diagnosis present

## 2022-10-02 DIAGNOSIS — R748 Abnormal levels of other serum enzymes: Secondary | ICD-10-CM | POA: Diagnosis not present

## 2022-10-02 DIAGNOSIS — R030 Elevated blood-pressure reading, without diagnosis of hypertension: Secondary | ICD-10-CM

## 2022-10-02 LAB — BASIC METABOLIC PANEL
Anion gap: 9 (ref 5–15)
BUN: 6 mg/dL (ref 6–20)
CO2: 25 mmol/L (ref 22–32)
Calcium: 9 mg/dL (ref 8.9–10.3)
Chloride: 104 mmol/L (ref 98–111)
Creatinine, Ser: 0.98 mg/dL (ref 0.61–1.24)
GFR, Estimated: >60 mL/min (ref >60)
Glucose, Bld: 103 mg/dL — ABNORMAL HIGH (ref 70–99)
Potassium: 4.2 mmol/L (ref 3.5–5.1)
Sodium: 138 mmol/L (ref 135–145)

## 2022-10-02 LAB — LIPASE, BLOOD: Lipase: 89 U/L — ABNORMAL HIGH (ref 11–51)

## 2022-10-02 LAB — CBC
HCT: 46.3 % (ref 39.0–52.0)
Hemoglobin: 16.4 g/dL (ref 13.0–17.0)
MCH: 37.7 pg — ABNORMAL HIGH (ref 26.0–34.0)
MCHC: 35.4 g/dL (ref 30.0–36.0)
MCV: 106.4 fL — ABNORMAL HIGH (ref 80.0–100.0)
Platelets: 222 10*3/uL (ref 150–400)
RBC: 4.35 MIL/uL (ref 4.22–5.81)
RDW: 13.1 % (ref 11.5–15.5)
WBC: 7.5 10*3/uL (ref 4.0–10.5)
nRBC: 0 % (ref 0.0–0.2)

## 2022-10-02 LAB — HEPATIC FUNCTION PANEL
ALT: 22 U/L (ref 0–44)
AST: 25 U/L (ref 15–41)
Albumin: 3.8 g/dL (ref 3.5–5.0)
Alkaline Phosphatase: 118 U/L (ref 38–126)
Bilirubin, Direct: 0.2 mg/dL (ref 0.0–0.2)
Indirect Bilirubin: 0.4 mg/dL (ref 0.3–0.9)
Total Bilirubin: 0.6 mg/dL (ref 0.3–1.2)
Total Protein: 7.7 g/dL (ref 6.5–8.1)

## 2022-10-02 LAB — TROPONIN I (HIGH SENSITIVITY)
Troponin I (High Sensitivity): 6 ng/L (ref <18)
Troponin I (High Sensitivity): 7 ng/L (ref <18)

## 2022-10-02 LAB — D-DIMER, QUANTITATIVE: D-Dimer, Quant: 0.29 ug/mL-FEU (ref 0.00–0.50)

## 2022-10-02 MED ORDER — AMLODIPINE BESYLATE 10 MG PO TABS: 10.0000 mg | ORAL_TABLET | Freq: Every day | ORAL | 3 refills | Status: DC

## 2022-10-02 MED ORDER — VALSARTAN-HYDROCHLOROTHIAZIDE 320-25 MG PO TABS: 1.0000 | ORAL_TABLET | Freq: Every day | ORAL | 0 refills | Status: DC

## 2022-10-02 MED ORDER — AMLODIPINE BESYLATE 5 MG PO TABS
10.0000 mg | ORAL_TABLET | Freq: Once | ORAL | Status: AC
  Administered 2022-10-02: 10 mg via ORAL
  Filled 2022-10-02: qty 2

## 2022-10-02 NOTE — Telephone Encounter (Signed)
Noted  

## 2022-10-02 NOTE — Discharge Instructions (Signed)

## 2022-10-02 NOTE — ED Notes (Signed)
Patient is being discharged from the Urgent Care and sent to the Emergency Department via POV . Per HM, patient is in need of higher level of care due to CP. Patient is aware and verbalizes understanding of plan of care.  Vitals:   10/02/22 0940  BP: (!) 184/115  Pulse: 99  Resp: 18  Temp: 98.8 F (37.1 C)  SpO2: 98%

## 2022-10-02 NOTE — Discharge Instructions (Signed)
Please go straight to the ER as soon as you leave urgent care for further evaluation and management.

## 2022-10-02 NOTE — ED Triage Notes (Signed)
Pt here for intermittent left sided CP x 1 week; pt sts not taking BP meds x 2 months

## 2022-10-02 NOTE — ED Provider Notes (Signed)
Emergency Department Provider Note   I have reviewed the triage vital signs and the nursing notes.   HISTORY  Chief Complaint Chest Pain   HPI Jesse Mendoza is a 47 y.o. male with PMH of pancreatitis and HTN presents to the emergency department for evaluation of sharp, central chest pain.  Patient denies any injury.  Pain not worse with movement or deep breathing.  Not feeling short of breath.  No abdominal pain or vomiting.  Patient tells me symptoms have been intermittent for the last week.  He started urgent care was referred here for further evaluation.  He has reestablish care with his primary care physician and is starting blood pressure medications again, which he has been off of for some time.   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 Respiratory: Denies shortness of breath. Gastrointestinal: No abdominal pain.  No nausea, no vomiting.  Musculoskeletal: Negative for back pain. Skin: Negative for rash. Neurological: Negative for headaches.  ____________________________________________   PHYSICAL EXAM:  VITAL SIGNS: ED Triage Vitals  Enc Vitals Group     BP 10/02/22 1041 (!) 183/107     Pulse Rate 10/02/22 1041 91     Resp 10/02/22 1041 16     Temp 10/02/22 1041 98.7 F (37.1 C)     Temp Source 10/02/22 1041 Oral     SpO2 10/02/22 1041 100 %     Weight 10/02/22 1043 151 lb 0.2 oz (68.5 kg)     Height 10/02/22 1043 5' 5"$  (1.651 m)   Constitutional: Alert and oriented. Well appearing and in no acute distress. Eyes: Conjunctivae are normal.  Head: Atraumatic. Nose: No congestion/rhinnorhea. Mouth/Throat: Mucous membranes are moist. Neck: No stridor.   Cardiovascular: Normal rate, regular rhythm. Good peripheral circulation. Grossly normal heart sounds.   Respiratory: Normal  respiratory effort.  No retractions. Lungs CTAB. Gastrointestinal: Soft and nontender. No distention.  Musculoskeletal: No lower extremity tenderness nor edema. No gross deformities of extremities. Neurologic:  Normal speech and language. No gross focal neurologic deficits are appreciated.  Skin:  Skin is warm, dry and intact. No rash noted.  ____________________________________________   LABS (all labs ordered are listed, but only abnormal results are displayed)  Labs Reviewed  BASIC METABOLIC PANEL - Abnormal; Notable for the following components:      Result Value   Glucose, Bld 103 (*)    All other components within normal limits  CBC - Abnormal; Notable for the following components:   MCV 106.4 (*)    MCH 37.7 (*)    All other components within normal limits  LIPASE, BLOOD - Abnormal; Notable for the following components:   Lipase 89 (*)    All other components within normal limits  D-DIMER, QUANTITATIVE  HEPATIC FUNCTION PANEL  TROPONIN I (HIGH SENSITIVITY)  TROPONIN I (HIGH SENSITIVITY)   ____________________________________________  EKG   EKG Interpretation  Date/Time:  Tuesday October 02 2022 10:47:33 EST Ventricular Rate:  90 PR Interval:  136 QRS Duration: 82 QT Interval:  328 QTC Calculation: 401 R Axis:   81 Text Interpretation: Normal sinus rhythm Normal ECG When compared with ECG of 02-Oct-2022 09:38, PREVIOUS ECG IS PRESENT Confirmed by Nanda Quinton [phone]) on 10/02/2022 12:31:59 PM        ____________________________________________  RADIOLOGY  No results found.  ____________________________________________   PROCEDURES  Procedure(s)  performed:   Procedures  None  ____________________________________________   INITIAL IMPRESSION / ASSESSMENT AND PLAN / ED COURSE  Pertinent labs & imaging results that were available during my care of the patient were reviewed by me and considered in my medical decision making (see chart for  details).   This patient is Presenting for Evaluation of CP, which does require a range of treatment options, and is a complaint that involves a high risk of morbidity and mortality.  The Differential Diagnoses includes but is not exclusive to acute coronary syndrome, aortic dissection, pulmonary embolism, cardiac tamponade, community-acquired pneumonia, pericarditis, musculoskeletal chest wall pain, etc.   Critical Interventions-    Medications  amLODipine (NORVASC) tablet 10 mg (10 mg Oral Given 10/02/22 1621)    Reassessment after intervention: BP improved.    I decided to review pertinent External Data, and in summary no recent ED visits.   Clinical Laboratory Tests Ordered, included troponin normal. No leukocytosis. No AKI. Normal electrolytes.   Radiologic Tests Ordered, included CXR. I independently interpreted the images and agree with radiology interpretation.   Cardiac Monitor Tracing which shows NSR.   Social Determinants of Health Risk patient is a smoker.   Medical Decision Making: Summary:  Patient presents to the emergency department with chest pain with some radiation as above.  Symptoms ongoing for the past week.  Initial workup from triage is reassuring.  With slightly atypical quality I added a D-dimer which is also negative.  Mild lipase elevation but no abdominal tenderness or vomiting to suspect pancreatitis as the cause of symptoms.  Reevaluation with update and discussion with patient to discuss results. Stable for d/c with close PCP follow up plan.   Considered admission but workup here is reassuring.   Patient's presentation is most consistent with acute presentation with potential threat to life or bodily function.   Disposition: discharge  ____________________________________________  FINAL CLINICAL IMPRESSION(S) / ED DIAGNOSES  Final diagnoses:  Atypical chest pain    Note:  This document was prepared using Dragon voice recognition software and  may include unintentional dictation errors.  Nanda Quinton, MD, [facility]    [place], Wonda Old[facility], MD 10/05/22 [phone]

## 2022-10-02 NOTE — ED Provider Notes (Signed)
[facility]    CSN: [id number] Arrival date & time: 10/02/22  0921      History   Chief Complaint Chief Complaint  Patient presents with   Chest Injury    Entered by patient    HPI Jesse Mendoza is a 47 y.o. male.   Patient presents with left-sided chest pain that has been intermittent for the past week.  Reports that it typically occurs in the afternoon and last for a few minutes.  He describes it as a sharp chest pain.  States that it did penetrate through to the left upper back 1 time.  He reports some intermittent shortness of breath associated with it as well.  It does not radiate down the arm and he denies numbness or tingling to the left arm.  Denies dizziness, headache, blurred vision, nausea, vomiting.  Patient denies history of cardiac problems.  Reports that he does have a history of hypertension and has not taken his blood pressure medication since September given that he does not currently have a primary care doctor and ran out of his medication.  Denies injury to the chest.     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 No pertinent surgical history.     Home Medications    Prior to Admission medications   Medication Sig Start Date End Date Taking? Authorizing Provider  amLODipine (NORVASC) 10 MG tablet Take 1 tablet (10 mg total) by mouth daily. 10/18/21   Elsie Stain, MD  valsartan-hydrochlorothiazide (DIOVAN-HCT) 320-25 MG tablet TAKE 1 TABLET BY MOUTH DAILY 05/08/22   Elsie Stain, MD    Family History Family History  Problem Relation Age of Onset   Diabetes Mother    Stroke Father     Social  History Social History   Tobacco Use   Smoking status: Every Day    Packs/day: 0.50    Types: Cigarettes, Cigars   Smokeless tobacco: Never  Vaping Use   Vaping Use: Never used  Substance Use Topics   Alcohol use: Not Currently    Comment: about 4 times a week   Drug use: Yes    Types: Marijuana    Comment: occasionally     Allergies   Patient has no known allergies.   Review of Systems Review of Systems Per HPI  Physical Exam Triage Vital Signs ED Triage Vitals  Enc Vitals Group     BP 10/02/22 0940 (!) 184/115     Pulse Rate 10/02/22 0940 99     Resp 10/02/22 0940 18     Temp 10/02/22 0940 98.8 F (37.1 C)     Temp Source 10/02/22 0940 Oral     SpO2 10/02/22 0940 98 %     Weight --      Height --      Head Circumference --      Peak Flow --      Pain Score 10/02/22 0941 3     Pain Loc --      Pain Edu? --      Excl. in [place]? --    No data found.  Updated Vital Signs BP (!) 184/115 (BP Location: Left  Arm)   Pulse 99   Temp 98.8 F (37.1 C) (Oral)   Resp 18   SpO2 98%   Visual Acuity Right Eye Distance:   Left Eye Distance:   Bilateral Distance:    Right Eye Near:   Left Eye Near:    Bilateral Near:     Physical Exam Constitutional:      General: He is not in acute distress.    Appearance: Normal appearance. He is not toxic-appearing or diaphoretic.  HENT:     Head: Normocephalic and atraumatic.  Eyes:     Extraocular Movements: Extraocular movements intact.     Conjunctiva/sclera: Conjunctivae normal.     Pupils: Pupils are equal, round, and reactive to light.  Cardiovascular:     Rate and Rhythm: Normal rate and regular rhythm.     Pulses: Normal pulses.     Heart sounds: Normal heart sounds.  Pulmonary:     Effort: Pulmonary effort is normal. No respiratory distress.     Breath sounds: Normal breath sounds.  Chest:     Chest wall: No tenderness.  Neurological:     General: No focal deficit present.     Mental Status: He is alert  and oriented to person, place, and time. Mental status is at baseline.     Cranial Nerves: Cranial nerves 2-12 are intact.     Sensory: Sensation is intact.     Motor: Motor function is intact.     Coordination: Coordination is intact.     Gait: Gait is intact.  Psychiatric:        Mood and Affect: Mood normal.        Behavior: Behavior normal.        Thought Content: Thought content normal.        Judgment: Judgment normal.      UC Treatments / Results  Labs (all labs ordered are listed, but only abnormal results are displayed) Labs Reviewed - No data to display  EKG   Radiology No results found.  Procedures Procedures (including critical care time)  Medications Ordered in UC Medications - No data to display  Initial Impression / Assessment and Plan / UC Course  I have reviewed the triage vital signs and the nursing notes.  Pertinent labs & imaging results that were available during my care of the patient were reviewed by me and considered in my medical decision making (see chart for details).     EKG completed that shows some mild ST elevation in V2 and V3.  No prior EKGs for comparison.  Given EKG changes, left-sided chest pain, significantly elevated blood pressure reading I do think that this warrants further evaluation and management at the emergency department.  Do not have resources here in urgent care to evaluate this type of chest pain.  Therefore, patient was advised to go to the ER for further evaluation and management and was agreeable.  Suggested EMS transport but patient declined.  Risks associated with not going by EMS were discussed with patient.  Patient voiced understanding and accepted risks.  He wishes self transport.  Patient left via self transport to go to the emergency department. Final Clinical Impressions(s) / UC Diagnoses   Final diagnoses:  Other chest pain  Elevated blood pressure reading     Discharge Instructions      Please go  straight to the ER as soon as you leave urgent care for further evaluation and management.    ED Prescriptions   None  PDMP not reviewed this encounter.   Teodora Medici, [place] 10/02/22 [phone]

## 2022-10-02 NOTE — ED Notes (Signed)
Dr. Laverta [place] notified of elevated BP

## 2022-10-02 NOTE — ED Triage Notes (Signed)
Pt sent by UC. Pt c/o non radiating left sided chest painx1wk. Pt denies N/V, SOB

## 2022-10-10 ENCOUNTER — Encounter: Payer: Self-pay | Admitting: Physician Assistant

## 2022-10-10 ENCOUNTER — Ambulatory Visit: Payer: 59 | Attending: Critical Care Medicine | Admitting: Physician Assistant

## 2022-10-10 VITALS — BP 102/68 | HR 106 | Temp 99.8°F | Wt 147.4 lb

## 2022-10-10 DIAGNOSIS — R748 Abnormal levels of other serum enzymes: Secondary | ICD-10-CM

## 2022-10-10 DIAGNOSIS — I10 Essential (primary) hypertension: Secondary | ICD-10-CM

## 2022-10-10 DIAGNOSIS — Z09 Encounter for follow-up examination after completed treatment for conditions other than malignant neoplasm: Secondary | ICD-10-CM | POA: Diagnosis not present

## 2022-10-10 DIAGNOSIS — R0789 Other chest pain: Secondary | ICD-10-CM | POA: Diagnosis not present

## 2022-10-10 MED ORDER — VALSARTAN-HYDROCHLOROTHIAZIDE 320-25 MG PO TABS: 1.0000 | ORAL_TABLET | Freq: Every day | ORAL | 1 refills | Status: DC

## 2022-10-10 NOTE — Patient Instructions (Signed)
Lipase Test Why am I having this test? The lipase test is often used to check for problems with the pancreas. Lipase is a protein (enzyme) that is released by the pancreas into the small intestine to help digest a type of fat called triglycerides. You may have this test if your health care provider suspects that you have damage to or infection of your pancreas (pancreatitis). These problems can cause your pancreas to release more lipase. The test is often done along with other blood tests. If you have pain in your abdomen (abdominal pain), your health care provider may use this test to help figure out the cause of your pain. The test can help determine if the pain is related to your pancreas or to other causes. What is being tested? This test measures the amount of lipase in your blood. What kind of sample is taken?  A blood sample is required for this test. It is usually collected by inserting a needle into a blood vessel. How do I prepare for this test? Do not eat or drink anything except water for 8-12 hours before the test or as told by your health care provider. Tell your health care provider about all medicines you are taking, including vitamins, herbs, eye drops, creams, and over-the-counter medicines. How are the results reported? Your test results will be reported as values that indicate the amount of lipase in your blood. Your health care provider will compare your results to normal ranges that were established after testing a large group of people (reference ranges). Reference ranges may vary among labs and hospitals. For this test, a common reference range is 0-160 units/L (SI units). What do the results mean? Lipase levels that are higher than the reference range can be seen with many health conditions. These may include: Disease of the pancreas. Disease of the gallbladder. Kidney failure. Blockage (obstruction) of the bowel. Tissue death caused by a lack of blood supply to the bowel  (bowel infarction). Swelling or infection of salivary glands. Peptic ulcer disease. Talk with your health care provider about what your results mean. Questions to ask your health care provider Ask your health care provider, or the department that is doing the test: When will my results be ready? How will I get my results? What are my treatment options? What other tests do I need? What are my next steps? Summary The lipase test is often used to check for problems with the pancreas. Lipase is a protein (enzyme) that is released by the pancreas to help digest a type of fat called triglycerides. You may have this test if your health care provider suspects that your pancreas has been damaged or is infected (pancreatitis). Pancreatitis can cause your pancreas to release more lipase. Lipase levels that are higher than normal can indicate other health conditions, too. Talk with your health care provider about what your results mean. This information is not intended to replace advice given to you by your health care provider. Make sure you discuss any questions you have with your health care provider. Document Revised: 07/04/2021 Document Reviewed: 07/04/2021 Elsevier Patient Education  Between.

## 2022-10-10 NOTE — Progress Notes (Signed)
Patient ID: Jesse Mendoza, male   DOB: 1976/03/01, 47 y.o.   MRN: [id number]    Jesse Mendoza, is a 47 y.o. male  [id number]  [id number]  DOB - 1976/02/25  Chief Complaint  Patient presents with   Chest Pain    Follow-up to chest pain. Deny  chest pain with this visit       Subjective:   Jesse Mendoza is a 47 y.o. male here today for a follow up visit Being seen in the ED for CP on 10/02/2022.  Cp has since resolved.  No further CP since seen at ED.  Denies nausea.  Was unaware that he has had pancreatitis.  He did have an elevated lipase of 89 at ED.  No abdominal pain or vomiting.  He had been off BP meds for a Weisse time.  They sent him amlodipine and valsartan which is what he was on previously.  He is tolerating it well without dizziness or SE.     From ED visit HPI Jesse Mendoza is a 48 y.o. male with PMH of pancreatitis and HTN presents to the emergency department for evaluation of sharp, central chest pain.  Patient denies any injury.  Pain not worse with movement or deep breathing.  Not feeling short of breath.  No abdominal pain or vomiting.  Patient tells me symptoms have been intermittent for the last week.  He started urgent care was referred here for further evaluation.  He has reestablish care with his primary care physician and is starting blood pressure medications again, which he has been off of for some time. No problems updated.  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 Authorizing Provider  amLODipine (NORVASC) 10 MG tablet Take 1 tablet (10 mg total) by mouth daily. 10/02/22   Sabado, Wonda Olds, MD  valsartan-hydrochlorothiazide (DIOVAN-HCT) 320-25 MG tablet Take 1 tablet by mouth daily. 10/10/22   Lenny Bouchillon, Dionne Bucy, PA-C     ROS: Neg HEENT Neg resp Neg GI Neg GU Neg MS Neg psych Neg neuro  Objective:   Vitals:   10/10/22 0942  BP: 102/68  Pulse: (!) 106  Temp: 99.8 F (37.7 C)  SpO2: 100%  Weight: 147 lb 6.4 oz (66.9 kg)   Exam General appearance : Awake, alert, not in any distress. Speech Clear. Not toxic looking HEENT: Atraumatic and Normocephalic Neck: Supple, no JVD. No cervical lymphadenopathy.  Chest: Good air entry bilaterally, CTAB.  No rales/rhonchi/wheezing CVS: S1 S2 regular, no murmurs.  Extremities: B/L Lower Ext shows no edema, both legs are warm to touch Neurology: Awake alert, and oriented X 3, CN II-XII intact, Non focal Skin: No Rash  Data Review No results found for: "HGBA1C"  Assessment & Plan   1. Essential hypertension Controlled-continue amlodipine and diovan - valsartan-hydrochlorothiazide (DIOVAN-HCT) 320-25 MG tablet; Take 1 tablet by mouth daily.  Dispense: 90 tablet; Refill: 1 - Basic metabolic panel  2. Elevated lipase Avoid fatty foods/alcohol - Lipase  3. Other chest pain Resolved.  EKG and troponins neg  4. Encounter for examination following treatment at hospital    Return in about 6 months (around 04/10/2023) for PCP for chronic conditions.  The patient was given clear instructions to go to ER or return to medical center if symptoms don't improve,  worsen or new problems develop. The patient verbalized understanding. The patient was told to call to get lab results if they haven't heard anything in the next week.      Freeman Caldron, PA-C [facility], [place]   10/10/2022, 9:57 AM

## 2022-10-11 LAB — BASIC METABOLIC PANEL
BUN/Creatinine Ratio: 14 (ref 9–20)
BUN: 17 mg/dL (ref 6–24)
CO2: 23 mmol/L (ref 20–29)
Calcium: 9.9 mg/dL (ref 8.7–10.2)
Chloride: 94 mmol/L — ABNORMAL LOW (ref 96–106)
Creatinine, Ser: 1.19 mg/dL (ref 0.76–1.27)
Glucose: 94 mg/dL (ref 70–99)
Potassium: 4.8 mmol/L (ref 3.5–5.2)
Sodium: 135 mmol/L (ref 134–144)
eGFR: 77 mL/min/{1.73_m2} (ref >59)

## 2022-10-11 LAB — LIPASE: Lipase: 118 U/L — ABNORMAL HIGH (ref 13–78)

## 2022-10-31 ENCOUNTER — Ambulatory Visit: Payer: 59 | Admitting: Physician Assistant

## 2023-04-10 ENCOUNTER — Ambulatory Visit: Payer: 59 | Admitting: Critical Care Medicine

## 2023-04-10 NOTE — Progress Notes (Unsigned)
Established Patient Office Visit  Subjective:  Patient ID: Jesse Mendoza, male    DOB: October 08, 1975  Age: 47 y.o. MRN: [id number]  CC:  No chief complaint on file.   HPI 09/2021 Jesse Mendoza presents for follow-up of hypertension and syphilis.  The patient had positive syphilis antibodies at the last visit in September.  He had fading rash over his entire body.  He had an active case in September 21.  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  Family History  Problem Relation Age of Onset   Diabetes Mother    Stroke Father     Social History   Socioeconomic History   Marital status: Single    Spouse name: Not on file   Number of children: Not on file   Years of education: Not on file   Highest education level: 12th grade  Occupational History   Not on file  Tobacco Use   Smoking status: Every Day    Current packs/day: 0.50    Types: Cigarettes, Cigars   Smokeless tobacco: Never  Vaping Use   Vaping status: Never Used  Substance and Sexual Activity   Alcohol use: Not Currently    Comment: about 4  times a week   Drug use: Yes    Types: Marijuana    Comment: occasionally   Sexual activity: Not on file  Other Topics Concern   Not on file  Social History Narrative   Not on file   Social Determinants of Health   Financial Resource Strain: Low Risk  (04/07/2023)   Overall Financial Resource Strain (CARDIA)    Difficulty of Paying Living Expenses: Not very hard  Food Insecurity: No Food Insecurity (04/07/2023)   Hunger Vital Sign    Worried About Running Out of Food in the Last Year: Never true    Ran Out of Food in the Last Year: Never true  Transportation Needs: No Transportation Needs (04/07/2023)   PRAPARE - Administrator, Civil Service (Medical): No    Lack of Transportation (Non-Medical): No  Physical Activity: Insufficiently Active (04/07/2023)   Exercise Vital Sign    Days of Exercise per Week: 2 days    Minutes of Exercise per Session: 10 min  Stress: No Stress Concern Present (04/07/2023)   [employer] of Occupational Health - Occupational Stress Questionnaire    Feeling of Stress : Not at all  Social Connections: Socially Isolated (04/07/2023)   Social Connection and Isolation Panel [NHANES]    Frequency of Communication with Friends and Family: More than three times a week  Frequency of Social Gatherings with Friends and Family: More than three times a week    Attends Religious Services: Never    Database administrator or Organizations: No    Attends Engineer, structural: Not on file    Marital Status: Never married  Intimate Partner Violence: Not on file    Outpatient Medications Prior to Visit  Medication Sig Dispense Refill   amLODipine (NORVASC) 10 MG tablet Take 1 tablet (10 mg total) by mouth daily. 90 tablet 3   valsartan-hydrochlorothiazide (DIOVAN-HCT) 320-25 MG tablet Take 1 tablet by mouth daily. 90 tablet 1   No facility-administered medications prior to visit.    No Known Allergies  ROS Review of Systems   Constitutional: Negative.   HENT: Negative.  Negative for ear pain, postnasal drip, rhinorrhea, sinus pressure, sore throat, trouble swallowing and voice change.   Eyes: Negative.   Respiratory: Negative.  Negative for apnea, cough, choking, chest tightness, shortness of breath, wheezing and stridor.   Cardiovascular: Negative.  Negative for chest pain, palpitations and leg swelling.  Gastrointestinal: Negative.  Negative for abdominal distention, abdominal pain, nausea and vomiting.  Genitourinary: Negative.   Musculoskeletal: Negative.  Negative for arthralgias and myalgias.  Skin:  Positive for rash.  Allergic/Immunologic: Negative.  Negative for environmental allergies and food allergies.  Neurological: Negative.  Negative for dizziness, syncope, weakness and headaches.  Hematological: Negative.  Negative for adenopathy. Does not bruise/bleed easily.  Psychiatric/Behavioral: Negative.  Negative for agitation and sleep disturbance. The patient is not nervous/anxious.       Objective:    Physical Exam Vitals reviewed.  Constitutional:      Appearance: Normal appearance. He is well-developed. He is not diaphoretic.  HENT:     Head: Normocephalic and atraumatic.     Nose: No nasal deformity, septal deviation, mucosal edema or rhinorrhea.     Right Sinus: No maxillary sinus tenderness or frontal sinus tenderness.     Left Sinus: No maxillary sinus tenderness or frontal sinus tenderness.     Mouth/Throat:     Pharynx: No oropharyngeal exudate.  Eyes:     General: No scleral icterus.    Conjunctiva/sclera: Conjunctivae normal.     Pupils: Pupils are equal, round, and reactive to light.  Neck:     Thyroid: No thyromegaly.     Vascular: No carotid bruit or JVD.     Trachea: Trachea normal. No tracheal tenderness or tracheal deviation.  Cardiovascular:     Rate and Rhythm: Normal rate and regular rhythm.     Chest Wall: PMI is not displaced.     Pulses: Normal pulses. No decreased  pulses.     Heart sounds: Normal heart sounds, S1 normal and S2 normal. Heart sounds not distant. No murmur heard.    No systolic murmur is present.     No diastolic murmur is present.     No friction rub. No gallop. No S3 or S4 sounds.  Pulmonary:     Effort: No tachypnea, accessory muscle usage or respiratory distress.     Breath sounds: No stridor. No decreased breath sounds, wheezing, rhonchi or rales.  Chest:     Chest wall: No tenderness.  Abdominal:     General: Bowel sounds are normal. There is no distension.     Palpations: Abdomen is soft. Abdomen is not rigid.     Tenderness: There is no abdominal tenderness. There is no guarding or rebound.  Musculoskeletal:  General: Normal range of motion.     Cervical back: Normal range of motion and neck supple. No edema, erythema or rigidity. No muscular tenderness. Normal range of motion.  Lymphadenopathy:     Head:     Right side of head: No submental or submandibular adenopathy.     Left side of head: No submental or submandibular adenopathy.     Cervical: No cervical adenopathy.  Skin:    General: Skin is warm and dry.     Coloration: Skin is not pale.     Findings: Rash present.     Nails: There is no clubbing.     Comments: There is a diffuse circular faded rash on posterior anterior chest soles of the feet and lower extremities and arms consistent with prior syphilitic involvement  His previous antibody levels were greater than 1-1    Neurological:     Mental Status: He is alert and oriented to person, place, and time.     Sensory: No sensory deficit.  Psychiatric:        Speech: Speech normal.        Behavior: Behavior normal.     There were no vitals taken for this visit. Wt Readings from Last 3 Encounters:  10/10/22 147 lb 6.4 oz (66.9 kg)  10/02/22 151 lb 0.2 oz (68.5 kg)  12/12/21 151 lb (68.5 kg)     Health Maintenance Due  Topic Date Due   DTaP/Tdap/Td (3 - Td or Tdap) 01/08/2021   COVID-19  Vaccine (3 - 2023-24 season) 04/27/2022   Colonoscopy  Never done    There are no preventive care reminders to display for this patient.  Lab Results  Component Value Date   TSH 1.910 05/09/2021   Lab Results  Component Value Date   WBC 7.5 10/02/2022   HGB 16.4 10/02/2022   HCT 46.3 10/02/2022   MCV 106.4 (H) 10/02/2022   PLT 222 10/02/2022   Lab Results  Component Value Date   NA 135 10/10/2022   K 4.8 10/10/2022   CO2 23 10/10/2022   GLUCOSE 94 10/10/2022   BUN 17 10/10/2022   CREATININE 1.19 10/10/2022   BILITOT 0.6 10/02/2022   ALKPHOS 118 10/02/2022   AST 25 10/02/2022   ALT 22 10/02/2022   PROT 7.7 10/02/2022   ALBUMIN 3.8 10/02/2022   CALCIUM 9.9 10/10/2022   ANIONGAP 9 10/02/2022   EGFR 77 10/10/2022   Lab Results  Component Value Date   CHOL 215 (H) 10/18/2021   Lab Results  Component Value Date   HDL 27 (L) 10/18/2021   Lab Results  Component Value Date   LDLCALC 163 (H) 10/18/2021   Lab Results  Component Value Date   TRIG 137 10/18/2021   Lab Results  Component Value Date   CHOLHDL 8.0 (H) 10/18/2021   No results found for: "HGBA1C"    Assessment & Plan:   Problem List Items Addressed This Visit   None    No orders of the defined types were placed in this encounter.   Follow-up: No follow-ups on file.    Shan Levans, MD

## 2023-04-11 ENCOUNTER — Ambulatory Visit: Payer: 59 | Attending: Critical Care Medicine | Admitting: Critical Care Medicine

## 2023-04-11 ENCOUNTER — Encounter: Payer: Self-pay | Admitting: Critical Care Medicine

## 2023-04-11 VITALS — BP 99/63 | HR 92 | Ht 65.0 in | Wt 147.8 lb

## 2023-04-11 DIAGNOSIS — Z1211 Encounter for screening for malignant neoplasm of colon: Secondary | ICD-10-CM

## 2023-04-11 DIAGNOSIS — E782 Mixed hyperlipidemia: Secondary | ICD-10-CM | POA: Insufficient documentation

## 2023-04-11 DIAGNOSIS — I10 Essential (primary) hypertension: Secondary | ICD-10-CM | POA: Insufficient documentation

## 2023-04-11 DIAGNOSIS — Z720 Tobacco use: Secondary | ICD-10-CM

## 2023-04-11 DIAGNOSIS — Z8619 Personal history of other infectious and parasitic diseases: Secondary | ICD-10-CM | POA: Insufficient documentation

## 2023-04-11 DIAGNOSIS — I739 Peripheral vascular disease, unspecified: Secondary | ICD-10-CM | POA: Diagnosis not present

## 2023-04-11 DIAGNOSIS — F17290 Nicotine dependence, other tobacco product, uncomplicated: Secondary | ICD-10-CM | POA: Diagnosis not present

## 2023-04-11 DIAGNOSIS — I2510 Atherosclerotic heart disease of native coronary artery without angina pectoris: Secondary | ICD-10-CM | POA: Diagnosis not present

## 2023-04-11 DIAGNOSIS — A539 Syphilis, unspecified: Secondary | ICD-10-CM | POA: Diagnosis present

## 2023-04-11 MED ORDER — BLOOD PRESSURE KIT DEVI: 0 refills | Status: AC

## 2023-04-11 MED ORDER — ROSUVASTATIN CALCIUM 40 MG PO TABS: 40.0000 mg | ORAL_TABLET | Freq: Every day | ORAL | 3 refills | Status: DC

## 2023-04-11 MED ORDER — VALSARTAN-HYDROCHLOROTHIAZIDE 320-25 MG PO TABS: 1.0000 | ORAL_TABLET | Freq: Every day | ORAL | 1 refills | Status: DC

## 2023-04-11 NOTE — Assessment & Plan Note (Signed)
Start rosuvastatin 40 mg daily and get patient back to vascular surgery

## 2023-04-11 NOTE — Assessment & Plan Note (Signed)
Doing well on valsartan HCT alone will discontinue further orders for amlodipine and check labs

## 2023-04-11 NOTE — Patient Instructions (Addendum)
OMRON bp monitor is the best Start rosuvastatin for cholesterol Stop amlodipine Stay on vasartan hct Lab in 3 weeks Return 6 months Appt with vascular needed Colon cancer test ordered

## 2023-04-11 NOTE — Assessment & Plan Note (Signed)
Not active 

## 2023-04-11 NOTE — Assessment & Plan Note (Signed)
    Current smoking consumption amount: 2 cigarillos a day  Dicsussion on advise to quit smoking and smoking impacts: Cardiovascular impact  Patient's willingness to quit: Willing to quit  Methods to quit smoking discussed: Nicotine replacement with lozenge 4 mg 3 times daily and behavioral modification  Medication management of smoking session drugs discussed: Nicotine lozenge  Resources provided:  AVS   Setting quit date not established  Follow-up arranged 2 months   Time spent counseling the patient: 5 minutes

## 2023-04-25 ENCOUNTER — Other Ambulatory Visit: Payer: Self-pay | Admitting: Physician Assistant

## 2023-04-25 DIAGNOSIS — I10 Essential (primary) hypertension: Secondary | ICD-10-CM

## 2023-05-02 ENCOUNTER — Ambulatory Visit: Payer: 59 | Attending: Critical Care Medicine

## 2023-05-02 DIAGNOSIS — E782 Mixed hyperlipidemia: Secondary | ICD-10-CM

## 2023-05-02 DIAGNOSIS — I739 Peripheral vascular disease, unspecified: Secondary | ICD-10-CM

## 2023-05-03 ENCOUNTER — Telehealth: Payer: Self-pay | Admitting: Critical Care Medicine

## 2023-05-03 ENCOUNTER — Telehealth: Payer: Self-pay

## 2023-05-03 LAB — HEPATIC FUNCTION PANEL
ALT: 15 IU/L (ref 0–44)
AST: 14 IU/L (ref 0–40)
Albumin: 4 g/dL — ABNORMAL LOW (ref 4.1–5.1)
Alkaline Phosphatase: 119 IU/L (ref 44–121)
Bilirubin Total: 0.2 mg/dL (ref 0.0–1.2)
Bilirubin, Direct: <0.1 mg/dL (ref 0.00–0.40)
Total Protein: 6.7 g/dL (ref 6.0–8.5)

## 2023-05-03 LAB — LIPID PANEL
Chol/HDL Ratio: 5.4 ratio — ABNORMAL HIGH (ref 0.0–5.0)
Cholesterol, Total: 124 mg/dL (ref 100–199)
HDL: 23 mg/dL — ABNORMAL LOW (ref >39)
LDL Chol Calc (NIH): 78 mg/dL (ref 0–99)
Triglycerides: 128 mg/dL (ref 0–149)
VLDL Cholesterol Cal: 23 mg/dL (ref 5–40)

## 2023-05-03 NOTE — Telephone Encounter (Signed)
-----   Message from Shan Levans sent at 05/03/2023  5:46 AM EDT ----- Let pt know cholesterol is at goal, liver normal stay on cholesterol medication

## 2023-05-03 NOTE — Progress Notes (Signed)
Let pt know cholesterol is at goal, liver normal stay on cholesterol medication

## 2023-05-03 NOTE — Telephone Encounter (Signed)
Medication Refill - Medication:  valsartan-hydrochlorothiazide (DIOVAN-HCT) 320-25 MG tablet *Ran out of medication this morning   Has the patient contacted their pharmacy? Yes, advised to contact PCP  Preferred Pharmacy (with phone number or street name):  [id number] - [place], [place] - [address] AT [facility] [address] [facility] [address]  Phone: [phone] Fax: [phone]   Has the patient been seen for an appointment in the last year OR does the patient have an upcoming appointment? Yes, follow up scheduled for 2.19.2025 with PCP

## 2023-05-03 NOTE — Telephone Encounter (Signed)
[facility] called and spoke to [place], [facility] about the refill(s) valsartan-hydrochlorothiazide requested. Advised it was sent on 04/11/23 #90/1 refill(s). She says they have it on his profile and will refill it.

## 2023-05-03 NOTE — Telephone Encounter (Signed)
Pt was called and vm was left, Information has been sent to nurse pool.   

## 2023-10-16 ENCOUNTER — Ambulatory Visit: Payer: 59 | Attending: Family Medicine | Admitting: Family Medicine

## 2023-10-16 VITALS — BP 117/72 | HR 98 | Ht 65.0 in | Wt 151.6 lb

## 2023-10-16 DIAGNOSIS — I739 Peripheral vascular disease, unspecified: Secondary | ICD-10-CM | POA: Diagnosis not present

## 2023-10-16 DIAGNOSIS — E782 Mixed hyperlipidemia: Secondary | ICD-10-CM | POA: Insufficient documentation

## 2023-10-16 DIAGNOSIS — Z131 Encounter for screening for diabetes mellitus: Secondary | ICD-10-CM | POA: Diagnosis not present

## 2023-10-16 DIAGNOSIS — I10 Essential (primary) hypertension: Secondary | ICD-10-CM | POA: Diagnosis not present

## 2023-10-16 DIAGNOSIS — Z720 Tobacco use: Secondary | ICD-10-CM | POA: Diagnosis not present

## 2023-10-16 MED ORDER — VALSARTAN-HYDROCHLOROTHIAZIDE 320-25 MG PO TABS: 1.0000 | ORAL_TABLET | Freq: Every day | ORAL | 1 refills | Status: DC

## 2023-10-16 MED ORDER — BUPROPION HCL ER (XL) 150 MG PO TB24: 150.0000 mg | ORAL_TABLET | Freq: Every day | ORAL | 1 refills | Status: DC

## 2023-10-16 MED ORDER — ROSUVASTATIN CALCIUM 40 MG PO TABS: 40.0000 mg | ORAL_TABLET | Freq: Every day | ORAL | 1 refills | Status: DC

## 2023-10-16 NOTE — Patient Instructions (Signed)
 VISIT SUMMARY:  Today, we reviewed your ongoing health conditions, including peripheral arterial disease (PAD), hypertension, and hyperlipidemia. We discussed your current medications, smoking habits, and recent life changes. We also planned some follow-up actions to ensure your continued well-being.  YOUR PLAN:  -PERIPHERAL ARTERIAL DISEASE: Peripheral Arterial Disease (PAD) is a condition where the blood vessels in the legs are narrowed, reducing blood flow. You reported less frequent leg pain since starting your blood pressure medication. We will refer you to a vascular specialist for further follow-up and encourage you to quit smoking. Bupropion has been offered to help you with smoking cessation.  -HYPERTENSION: Hypertension, or high blood pressure, is when the force of the blood against your artery walls is too high. Your blood pressure is well-controlled at 117/72 with your current medication, Valsartan Hydrochlorothiazide. Continue taking this medication as prescribed.  -HYPERLIPIDEMIA: Hyperlipidemia is when you have high levels of fats (lipids) in your blood. You are taking Rosuvastatin and your cholesterol levels were normal at your last check in September. Continue with your current medication. We will also order a comprehensive metabolic panel to check your kidney and liver function, electrolytes, and cholesterol.  -DIABETES SCREENING: Diabetes screening is important to check if you have high blood sugar levels, which can lead to diabetes. You have not had a recent screening, so we will add this to your lab order.  -GENERAL HEALTH MAINTENANCE: For your general health, we will defer your colonoscopy until your employment and insurance status is more stable. We will check your labs, including a comprehensive metabolic panel and diabetes screening. Additionally, we will prescribe Bupropion to assist with smoking cessation.  INSTRUCTIONS:  Please follow up with the vascular specialist as  referred. Continue taking your current medications as prescribed. Get the lab tests done as ordered, including the comprehensive metabolic panel and diabetes screening. Start taking Bupropion as prescribed to help with smoking cessation. We will defer your colonoscopy until your employment and insurance status is more stable.

## 2023-10-16 NOTE — Progress Notes (Signed)
 Subjective:  Patient ID: Jesse Mendoza, male    DOB: 29-Aug-1975  Age: 48 y.o. MRN: [id number]  CC: Medical Management of Chronic Issues   HPI Jesse Mendoza is a 48 y.o. year old male with a history of PAD, HTN, Hyperlipidemia.  Interval History: Discussed the use of AI scribe software for clinical note transcription with the patient, who gave verbal consent to proceed.  He presents for a routine follow-up. He reports compliance with his rosuvastatin and valsartan hydrochlorothiazide. He has not had recent kidney function tests or diabetes screening. He has a history of PAD and has not seen a vascular specialist in over a year. He reports occasional claudication pain with exertion, which has improved since starting antihypertensive therapy. He continues to smoke one to two cigars daily, despite acknowledging the need to quit. He has not tried pharmacotherapy for smoking cessation in the past. He also reports a pending voluntary layoff from his job at [employer] where he states a whole bunch of staff were offered voluntary resignation.        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  Family History  Problem Relation Age of Onset   Diabetes Mother    Stroke Father     Social History   Socioeconomic History   Marital status: Single    Spouse name: Not on file   Number of children: Not on file   Years of education: Not on file   Highest education level: 12th grade  Occupational History   Not on file  Tobacco Use   Smoking status: Every Day    Current packs/day: 0.50    Average packs/day: 0.5 packs/day for 22.5 years (11.3 ttl pk-yrs)    Types: Cigarettes, Cigars    Start date: 04/10/2001   Smokeless tobacco: Never  Vaping Use   Vaping status: Never Used  Substance and Sexual Activity   Alcohol use: Yes    Comment: occ weekends   Drug  use: Yes    Types: Marijuana    Comment: occasionally   Sexual activity: Not on file  Other Topics Concern   Not on file  Social History Narrative   Not on file   Social Drivers of Health   Financial Resource Strain: Low Risk  (10/16/2023)   Overall Financial Resource Strain (CARDIA)    Difficulty of Paying Living Expenses: Not hard at all  Food Insecurity: No Food Insecurity (10/16/2023)   Hunger Vital Sign    Worried About Running Out of Food in the Last Year: Never true    Ran Out of Food in the Last Year: Never true  Transportation Needs: No Transportation Needs (10/16/2023)   PRAPARE - Administrator, Civil Service (Medical): No    Lack of Transportation (Non-Medical): No  Physical Activity: Unknown (10/16/2023)   Exercise Vital Sign    Days of Exercise per Week: 1 day    Minutes of Exercise per Session: Patient declined  Stress: No Stress Concern Present (10/16/2023)   [employer] of Occupational Health - Occupational Stress Questionnaire    Feeling of Stress : Not at all  Social Connections: Socially Isolated (10/16/2023)   Social Connection and Isolation Panel [NHANES]    Frequency of Communication with Friends and Family: More than three times a week    Frequency of Social Gatherings with Friends and Family: More than three  times a week    Attends Religious Services: Never    Active Member of Clubs or Organizations: No    Attends Banker Meetings: Never    Marital Status: Never married    No Known Allergies  Outpatient Medications Prior to Visit  Medication Sig Dispense Refill   Blood Pressure Monitoring (BLOOD PRESSURE KIT) DEVI Use to measure blood pressure 1 each 0   rosuvastatin (CRESTOR) 40 MG tablet Take 1 tablet (40 mg total) by mouth daily. 90 tablet 3   valsartan-hydrochlorothiazide (DIOVAN-HCT) 320-25 MG tablet Take 1 tablet by mouth daily. 90 tablet 1   No facility-administered medications prior to visit.     ROS Review  of Systems  Constitutional:  Negative for activity change and appetite change.  HENT:  Negative for sinus pressure and sore throat.   Respiratory:  Negative for chest tightness, shortness of breath and wheezing.   Cardiovascular:  Negative for chest pain and palpitations.  Gastrointestinal:  Negative for abdominal distention, abdominal pain and constipation.  Genitourinary: Negative.   Musculoskeletal: Negative.   Psychiatric/Behavioral:  Negative for behavioral problems and dysphoric mood.     Objective:  BP 117/72   Pulse 98   Ht 5\' 5"  (1.651 m)   Wt 151 lb 9.6 oz (68.8 kg)   SpO2 98%   BMI 25.23 kg/m      10/16/2023    9:23 AM 04/11/2023    9:57 AM 10/10/2022    9:42 AM  BP/Weight  Systolic BP 117 99 102  Diastolic BP 72 63 68  Wt. (Lbs) 151.6 147.8 147.4  BMI 25.23 kg/m2 24.6 kg/m2 24.53 kg/m2      Physical Exam Constitutional:      Appearance: He is well-developed.  Cardiovascular:     Rate and Rhythm: Normal rate.     Heart sounds: Normal heart sounds. No murmur heard. Pulmonary:     Effort: Pulmonary effort is normal.     Breath sounds: Normal breath sounds. No wheezing or rales.  Chest:     Chest wall: No tenderness.  Abdominal:     General: Bowel sounds are normal. There is no distension.     Palpations: Abdomen is soft. There is no mass.     Tenderness: There is no abdominal tenderness.  Musculoskeletal:        General: Normal range of motion.     Right lower leg: No edema.     Left lower leg: No edema.  Neurological:     Mental Status: He is alert and oriented to person, place, and time.  Psychiatric:        Mood and Affect: Mood normal.        Latest Ref Rng & Units 05/02/2023   10:27 AM 10/10/2022   10:07 AM 10/02/2022    2:05 PM  CMP  Glucose 70 - 99 mg/dL  94    BUN 6 - 24 mg/dL  17    Creatinine 1.61 - 1.27 mg/dL  0.96    Sodium 045 - 409 mmol/L  135    Potassium 3.5 - 5.2 mmol/L  4.8    Chloride 96 - 106 mmol/L  94    CO2 20 - 29 mmol/L   23    Calcium 8.7 - 10.2 mg/dL  9.9    Total Protein 6.0 - 8.5 g/dL 6.7   7.7   Total Bilirubin 0.0 - 1.2 mg/dL 0.2   0.6   Alkaline Phos 44 - 121 IU/L 119  118   AST 0 - 40 IU/L 14   25   ALT 0 - 44 IU/L 15   22     Lipid Panel     Component Value Date/Time   CHOL 124 05/02/2023 1027   TRIG 128 05/02/2023 1027   HDL 23 (L) 05/02/2023 1027   CHOLHDL 5.4 (H) 05/02/2023 1027   CHOLHDL 5 01/18/2009 1418   VLDL 11.6 01/18/2009 1418   LDLCALC 78 05/02/2023 1027   LDLDIRECT 157.2 01/18/2009 1418    CBC    Component Value Date/Time   WBC 7.5 10/02/2022 1049   RBC 4.35 10/02/2022 1049   HGB 16.4 10/02/2022 1049   HGB 11.8 (L) 10/18/2021 1125   HCT 46.3 10/02/2022 1049   HCT 33.8 (L) 10/18/2021 1125   PLT 222 10/02/2022 1049   PLT 312 10/18/2021 1125   MCV 106.4 (H) 10/02/2022 1049   MCV 104 (H) 10/18/2021 1125   MCH 37.7 (H) 10/02/2022 1049   MCHC 35.4 10/02/2022 1049   RDW 13.1 10/02/2022 1049   RDW 11.4 (L) 10/18/2021 1125   LYMPHSABS 2.8 10/18/2021 1125   MONOABS 0.3 01/18/2009 1418   EOSABS 0.2 10/18/2021 1125   BASOSABS 0.0 10/18/2021 1125    No results found for: "HGBA1C"  Assessment & Plan:      Peripheral Arterial Disease Reports less frequent leg pain with walking since starting antihypertensive medication. Last vascular consult was in April 2023. Patient is a current smoker. -Risk factor modification is imperative -Continue statin -Refer to vascular specialist for follow-up. -Encourage smoking cessation and offered Bupropion to assist with this.  Hypertension Blood pressure controlled at 117/72. Patient is on Valsartan/Hydrochlorothiazide. -Continue current medication regimen.  Hyperlipidemia Patient is on Rosuvastatin and reports good compliance. Last cholesterol check in September was within normal limits. -Continue current medication regimen. -Order comprehensive metabolic panel to check kidney and liver function, electrolytes, and  cholesterol.  Diabetes Screening No recent screening for diabetes. -Add diabetes screening to lab order.  General Health Maintenance -Defer colonoscopy per patient request -Check labs including comprehensive metabolic panel and diabetes screening. -Prescribe Bupropion to assist with smoking cessation.          Meds ordered this encounter  Medications   rosuvastatin (CRESTOR) 40 MG tablet    Sig: Take 1 tablet (40 mg total) by mouth daily.    Dispense:  90 tablet    Refill:  1   valsartan-hydrochlorothiazide (DIOVAN-HCT) 320-25 MG tablet    Sig: Take 1 tablet by mouth daily.    Dispense:  90 tablet    Refill:  1   buPROPion (WELLBUTRIN XL) 150 MG 24 hr tablet    Sig: Take 1 tablet (150 mg total) by mouth daily. For smoking cessation    Dispense:  90 tablet    Refill:  1    Follow-up: Return in about 6 months (around 04/14/2024) for Chronic medical conditions.       Hoy Register, MD, FAAFP. [facility] [place], [place] [phone]   10/16/2023, 9:48 AM

## 2023-10-17 ENCOUNTER — Other Ambulatory Visit: Payer: Self-pay | Admitting: Family Medicine

## 2023-10-17 ENCOUNTER — Encounter: Payer: Self-pay | Admitting: Family Medicine

## 2023-10-17 DIAGNOSIS — E875 Hyperkalemia: Secondary | ICD-10-CM

## 2023-10-17 LAB — LP+NON-HDL CHOLESTEROL
Cholesterol, Total: 145 mg/dL (ref 100–199)
HDL: 38 mg/dL — ABNORMAL LOW (ref >39)
LDL Chol Calc (NIH): 92 mg/dL (ref 0–99)
Total Non-HDL-Chol (LDL+VLDL): 107 mg/dL (ref 0–129)
Triglycerides: 78 mg/dL (ref 0–149)
VLDL Cholesterol Cal: 15 mg/dL (ref 5–40)

## 2023-10-17 LAB — CMP14+EGFR
ALT: 19 [IU]/L (ref 0–44)
AST: 16 [IU]/L (ref 0–40)
Albumin: 4.4 g/dL (ref 4.1–5.1)
Alkaline Phosphatase: 127 [IU]/L — ABNORMAL HIGH (ref 44–121)
BUN/Creatinine Ratio: 16 (ref 9–20)
BUN: 31 mg/dL — ABNORMAL HIGH (ref 6–24)
Bilirubin Total: <0.2 mg/dL (ref 0.0–1.2)
CO2: 20 mmol/L (ref 20–29)
Calcium: 9.7 mg/dL (ref 8.7–10.2)
Chloride: 102 mmol/L (ref 96–106)
Creatinine, Ser: 1.96 mg/dL — ABNORMAL HIGH (ref 0.76–1.27)
Globulin, Total: 2.8 g/dL (ref 1.5–4.5)
Glucose: 86 mg/dL (ref 70–99)
Potassium: 5.9 mmol/L — ABNORMAL HIGH (ref 3.5–5.2)
Sodium: 135 mmol/L (ref 134–144)
Total Protein: 7.2 g/dL (ref 6.0–8.5)
eGFR: 42 mL/min/{1.73_m2} — ABNORMAL LOW (ref >59)

## 2023-10-17 LAB — HEMOGLOBIN A1C
Est. average glucose Bld gHb Est-mCnc: 91 mg/dL
Hgb A1c MFr Bld: 4.8 % (ref 4.8–5.6)

## 2023-10-17 MED ORDER — SODIUM POLYSTYRENE SULFONATE PO POWD: Freq: Once | ORAL | 0 refills | Status: AC

## 2024-03-10 ENCOUNTER — Telehealth: Payer: Self-pay | Admitting: Family Medicine

## 2024-03-10 NOTE — Telephone Encounter (Signed)
 1st attempt contacted pt left vm to resch appt due to provider out of thee office

## 2024-03-11 ENCOUNTER — Telehealth: Payer: Self-pay | Admitting: Family Medicine

## 2024-03-11 NOTE — Telephone Encounter (Signed)
 Called pt to reschedule appt. Pt did not answer and per other front dest rep told me to cancel appt and we will reschedule once we speak with pt. Please advise

## 2024-03-27 ENCOUNTER — Other Ambulatory Visit: Payer: Self-pay | Admitting: Family Medicine

## 2024-03-27 DIAGNOSIS — I10 Essential (primary) hypertension: Secondary | ICD-10-CM

## 2024-03-29 ENCOUNTER — Other Ambulatory Visit: Payer: Self-pay | Admitting: Family Medicine

## 2024-03-29 DIAGNOSIS — I739 Peripheral vascular disease, unspecified: Secondary | ICD-10-CM

## 2024-04-14 ENCOUNTER — Ambulatory Visit: Payer: 59 | Admitting: Family Medicine

## 2024-05-26 ENCOUNTER — Ambulatory Visit: Attending: Family Medicine | Admitting: Family Medicine

## 2024-05-26 VITALS — BP 98/60 | HR 98 | Ht 65.0 in | Wt 141.2 lb

## 2024-05-26 DIAGNOSIS — I739 Peripheral vascular disease, unspecified: Secondary | ICD-10-CM | POA: Diagnosis not present

## 2024-05-26 DIAGNOSIS — Z1211 Encounter for screening for malignant neoplasm of colon: Secondary | ICD-10-CM

## 2024-05-26 DIAGNOSIS — N289 Disorder of kidney and ureter, unspecified: Secondary | ICD-10-CM

## 2024-05-26 DIAGNOSIS — I10 Essential (primary) hypertension: Secondary | ICD-10-CM

## 2024-05-26 DIAGNOSIS — E875 Hyperkalemia: Secondary | ICD-10-CM | POA: Diagnosis not present

## 2024-05-26 DIAGNOSIS — Z720 Tobacco use: Secondary | ICD-10-CM | POA: Diagnosis not present

## 2024-05-26 MED ORDER — BUPROPION HCL ER (XL) 150 MG PO TB24: 150.0000 mg | ORAL_TABLET | Freq: Every day | ORAL | 1 refills | Status: AC

## 2024-05-26 MED ORDER — ROSUVASTATIN CALCIUM 40 MG PO TABS: 40.0000 mg | ORAL_TABLET | Freq: Every day | ORAL | 1 refills | Status: AC

## 2024-05-26 MED ORDER — VALSARTAN 320 MG PO TABS: 320.0000 mg | ORAL_TABLET | Freq: Every day | ORAL | 3 refills | Status: AC

## 2024-05-26 NOTE — Patient Instructions (Signed)
 Please call Vascular for your appointment:  Placed in [facility]   [address]  [place], [place] [address] Ph# [phone]

## 2024-05-26 NOTE — Progress Notes (Signed)
 Subjective:  Patient ID: Jesse Mendoza, male    DOB: 1976/08/25  Age: 48 y.o. MRN: [id number]  CC: Medical Management of Chronic Issues (Referral to vascular)     Discussed the use of AI scribe software for clinical note transcription with the patient, who gave verbal consent to proceed.  History of Present Illness Deshon Hsiao is a 48 year old male with a history of PAD, HTN, Hyperlipidemia.  who presents for medication management and follow-up.  His blood pressure remains consistently low and he endorses adherence to valsartan / hydrochlorothiazide . He experiences numbness in his right leg at night, relieved by walking, and reports reduced leg pain while walking. He is actively reducing smoking with medication assistance and plans to quit within six months.  He would like a referral to vascular surgery.  Referral was placed 6 months ago however he did not follow through because he was uncertain about his health insurance given an unstable job situation.   Previous labs showed elevated potassium at 5.9 and slightly abnormal kidney function with a creatinine of 1.96 up from 1.16 a year prior, which he attributes to alcohol consumption which he did the night before his labs were drawn. He missed a follow-up lab but is willing to repeat it. He works at [employer] and previously delayed procedures due to job and insurance concerns.    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  Family History  Problem Relation Age of Onset   Diabetes Mother    Stroke Father     Social History   Socioeconomic History   Marital status: Single    Spouse name: Not on file   Number of children: Not on file   Years of education: Not on file   Highest education level: 12th grade  Occupational History   Not on file  Tobacco Use   Smoking status: Every Day    Current  packs/day: 0.50    Average packs/day: 0.5 packs/day for 23.1 years (11.6 ttl pk-yrs)    Types: Cigarettes, Cigars    Start date: 04/10/2001   Smokeless tobacco: Never  Vaping Use   Vaping status: Never Used  Substance and Sexual Activity   Alcohol use: Yes    Comment: occ weekends   Drug use: Yes    Types: Marijuana    Comment: occasionally   Sexual activity: Not on file  Other Topics Concern   Not on file  Social History Narrative   Not on file   Social Drivers of Health   Financial Resource Strain: Low Risk  (05/26/2024)   Overall Financial Resource Strain (CARDIA)    Difficulty of Paying Living Expenses: Not hard at all  Food Insecurity: No Food Insecurity (05/26/2024)   Hunger Vital Sign    Worried About Running Out of Food in the Last Year: Never true    Ran Out of Food in the Last Year: Never true  Transportation Needs: No Transportation Needs (05/26/2024)   PRAPARE - Administrator, Civil Service (Medical): No    Lack of Transportation (Non-Medical): No  Physical Activity: Insufficiently Active (05/26/2024)   Exercise Vital Sign    Days of Exercise per Week: 2 days    Minutes of Exercise per Session: 10 min  Stress: No Stress Concern Present (05/26/2024)   [employer] of Occupational Health - Occupational Stress Questionnaire  Feeling of Stress: Not at all  Social Connections: Socially Isolated (05/26/2024)   Social Connection and Isolation Panel    Frequency of Communication with Friends and Family: Twice a week    Frequency of Social Gatherings with Friends and Family: Twice a week    Attends Religious Services: Never    Database administrator or Organizations: No    Attends Engineer, structural: Not on file    Marital Status: Never married    No Known Allergies  Outpatient Medications Prior to Visit  Medication Sig Dispense Refill   Blood Pressure Monitoring (BLOOD PRESSURE KIT) DEVI Use to measure blood pressure 1 each 0    rosuvastatin  (CRESTOR ) 40 MG tablet TAKE 1 TABLET(40 MG) BY MOUTH DAILY 90 tablet 0   valsartan -hydrochlorothiazide  (DIOVAN -HCT) 320-25 MG tablet TAKE 1 TABLET BY MOUTH DAILY 90 tablet 1   buPROPion  (WELLBUTRIN  XL) 150 MG 24 hr tablet Take 1 tablet (150 mg total) by mouth daily. For smoking cessation (Patient not taking: Reported on 05/26/2024) 90 tablet 1   No facility-administered medications prior to visit.     ROS Review of Systems  Constitutional:  Negative for activity change and appetite change.  HENT:  Negative for sinus pressure and sore throat.   Respiratory:  Negative for chest tightness, shortness of breath and wheezing.   Cardiovascular:  Negative for chest pain and palpitations.  Gastrointestinal:  Negative for abdominal distention, abdominal pain and constipation.  Genitourinary: Negative.   Musculoskeletal:        See HPI  Psychiatric/Behavioral:  Negative for behavioral problems and dysphoric mood.     Objective:  BP (!) 95/57   Pulse 98   Ht 5' 5 (1.651 m)   Wt 141 lb 3.2 oz (64 kg)   SpO2 100%   BMI 23.50 kg/m      05/26/2024    8:44 AM 10/16/2023    9:23 AM 04/11/2023    9:57 AM  BP/Weight  Systolic BP 95 117 99  Diastolic BP 57 72 63  Wt. (Lbs) 141.2 151.6 147.8  BMI 23.5 kg/m2 25.23 kg/m2 24.6 kg/m2      Physical Exam Constitutional:      Appearance: He is well-developed.  Cardiovascular:     Rate and Rhythm: Normal rate.     Pulses: Normal pulses.     Heart sounds: Normal heart sounds. No murmur heard. Pulmonary:     Effort: Pulmonary effort is normal.     Breath sounds: Normal breath sounds. No wheezing or rales.  Chest:     Chest wall: No tenderness.  Abdominal:     General: Bowel sounds are normal. There is no distension.     Palpations: Abdomen is soft. There is no mass.     Tenderness: There is no abdominal tenderness.  Musculoskeletal:     Right lower leg: No edema.     Left lower leg: No edema.     Comments: Negative straight  leg raise bilaterally  Neurological:     Mental Status: He is alert and oriented to person, place, and time.  Psychiatric:        Mood and Affect: Mood normal.        Latest Ref Rng & Units 10/16/2023    9:56 AM 05/02/2023   10:27 AM 10/10/2022   10:07 AM  CMP  Glucose 70 - 99 mg/dL 86   94   BUN 6 - 24 mg/dL 31   17   Creatinine 9.23 -  1.27 mg/dL 8.03   8.80   Sodium 865 - 144 mmol/L 135   135   Potassium 3.5 - 5.2 mmol/L 5.9   4.8   Chloride 96 - 106 mmol/L 102   94   CO2 20 - 29 mmol/L 20   23   Calcium  8.7 - 10.2 mg/dL 9.7   9.9   Total Protein 6.0 - 8.5 g/dL 7.2  6.7    Total Bilirubin 0.0 - 1.2 mg/dL <9.7  0.2    Alkaline Phos 44 - 121 IU/L 127  119    AST 0 - 40 IU/L 16  14    ALT 0 - 44 IU/L 19  15      Lipid Panel     Component Value Date/Time   CHOL 145 10/16/2023 0956   TRIG 78 10/16/2023 0956   HDL 38 (L) 10/16/2023 0956   CHOLHDL 5.4 (H) 05/02/2023 1027   CHOLHDL 5 01/18/2009 1418   VLDL 11.6 01/18/2009 1418   LDLCALC 92 10/16/2023 0956   LDLDIRECT 157.2 01/18/2009 1418    CBC    Component Value Date/Time   WBC 7.5 10/02/2022 1049   RBC 4.35 10/02/2022 1049   HGB 16.4 10/02/2022 1049   HGB 11.8 (L) 10/18/2021 1125   HCT 46.3 10/02/2022 1049   HCT 33.8 (L) 10/18/2021 1125   PLT 222 10/02/2022 1049   PLT 312 10/18/2021 1125   MCV 106.4 (H) 10/02/2022 1049   MCV 104 (H) 10/18/2021 1125   MCH 37.7 (H) 10/02/2022 1049   MCHC 35.4 10/02/2022 1049   RDW 13.1 10/02/2022 1049   RDW 11.4 (L) 10/18/2021 1125   LYMPHSABS 2.8 10/18/2021 1125   MONOABS 0.3 01/18/2009 1418   EOSABS 0.2 10/18/2021 1125   BASOSABS 0.0 10/18/2021 1125    Lab Results  Component Value Date   HGBA1C 4.8 10/16/2023        Assessment & Plan Peripheral arterial disease Intermittent numbness in the right leg, improving with walking. Previous evaluation confirmed peripheral arterial disease, not severe. Smoking exacerbates condition. -Risk factor modification is  imperative - Continue statin - Provide contact information for vein and vascular specialist for follow-up appointment. - Encourage smoking cessation to improve symptoms.  Hypertension Blood pressure low at 95/57 mmHg. Current medication is valsartan -hydrochlorothiazide . Awaiting lab results to ensure potassium levels are not elevated before starting new medication. - Discontinue valsartan -hydrochlorothiazide . - Prescribe valsartan  only, pending lab results. - Perform blood work to reassess kidney function and potassium levels. - Advise to wait for lab results before picking up new medication.   Tobacco use disorder Actively smoking with a goal to quit within six months. Inconsistent use of smoking cessation medication. Smoking cessation crucial to prevent worsening of peripheral arterial disease. - Refill smoking cessation medication. - Encourage daily use of smoking cessation medication to aid in quitting -currently on bupropion  - Set goal to quit smoking by next appointment in six months.    Abnormal kidney function/hyperkalemia Previous labs showed slightly abnormal kidney function and elevated potassium at 5.9 mmol/L. Possible influence of alcohol consumption on lab results. - Perform blood work to reassess kidney function and potassium levels. - Review lab results and adjust medication if necessary.    Healthcare maintenance Screening for colon cancer-colonoscopy referral placed  Meds ordered this encounter  Medications   valsartan  (DIOVAN ) 320 MG tablet    Sig: Take 1 tablet (320 mg total) by mouth daily.    Dispense:  90 tablet    Refill:  3    Discontinue Valsartan /hctz   buPROPion  (WELLBUTRIN  XL) 150 MG 24 hr tablet    Sig: Take 1 tablet (150 mg total) by mouth daily. For smoking cessation    Dispense:  90 tablet    Refill:  1   rosuvastatin  (CRESTOR ) 40 MG tablet    Sig: Take 1 tablet (40 mg total) by mouth daily.    Dispense:  90 tablet    Refill:  1     Follow-up: Return in about 6 months (around 11/23/2024) for Chronic medical conditions.       Corrina Sabin, MD, FAAFP. [facility] [place], [place] [phone]   05/26/2024, 9:12 AM

## 2024-05-27 ENCOUNTER — Ambulatory Visit: Payer: Self-pay | Admitting: Family Medicine

## 2024-05-27 DIAGNOSIS — N289 Disorder of kidney and ureter, unspecified: Secondary | ICD-10-CM

## 2024-05-27 LAB — CMP14+EGFR
ALT: 16 IU/L (ref 0–44)
AST: 13 IU/L (ref 0–40)
Albumin: 4.4 g/dL (ref 4.1–5.1)
Alkaline Phosphatase: 119 IU/L (ref 47–123)
BUN/Creatinine Ratio: 13 (ref 9–20)
BUN: 46 mg/dL — ABNORMAL HIGH (ref 6–24)
Bilirubin Total: <0.2 mg/dL (ref 0.0–1.2)
CO2: 13 mmol/L — ABNORMAL LOW (ref 20–29)
Calcium: 8.9 mg/dL (ref 8.7–10.2)
Chloride: 107 mmol/L — ABNORMAL HIGH (ref 96–106)
Creatinine, Ser: 3.61 mg/dL — ABNORMAL HIGH (ref 0.76–1.27)
Globulin, Total: 2.7 g/dL (ref 1.5–4.5)
Glucose: 84 mg/dL (ref 70–99)
Potassium: 5 mmol/L (ref 3.5–5.2)
Sodium: 137 mmol/L (ref 134–144)
Total Protein: 7.1 g/dL (ref 6.0–8.5)
eGFR: 20 mL/min/1.73 — ABNORMAL LOW (ref >59)

## 2024-05-27 LAB — LP+NON-HDL CHOLESTEROL
Cholesterol, Total: 118 mg/dL (ref 100–199)
HDL: 29 mg/dL — ABNORMAL LOW (ref >39)
LDL Chol Calc (NIH): 71 mg/dL (ref 0–99)
Total Non-HDL-Chol (LDL+VLDL): 89 mg/dL (ref 0–129)
Triglycerides: 95 mg/dL (ref 0–149)
VLDL Cholesterol Cal: 18 mg/dL (ref 5–40)

## 2024-06-09 ENCOUNTER — Ambulatory Visit: Attending: Family Medicine

## 2024-06-09 DIAGNOSIS — N289 Disorder of kidney and ureter, unspecified: Secondary | ICD-10-CM

## 2024-06-10 ENCOUNTER — Ambulatory Visit: Payer: Self-pay | Admitting: Internal Medicine

## 2024-06-10 LAB — CMP14+EGFR
ALT: 18 IU/L (ref 0–44)
AST: 13 IU/L (ref 0–40)
Albumin: 4.1 g/dL (ref 4.1–5.1)
Alkaline Phosphatase: 128 IU/L — ABNORMAL HIGH (ref 47–123)
BUN/Creatinine Ratio: 10 (ref 9–20)
BUN: 16 mg/dL (ref 6–24)
Bilirubin Total: <0.2 mg/dL (ref 0.0–1.2)
CO2: 18 mmol/L — ABNORMAL LOW (ref 20–29)
Calcium: 8.8 mg/dL (ref 8.7–10.2)
Chloride: 108 mmol/L — ABNORMAL HIGH (ref 96–106)
Creatinine, Ser: 1.64 mg/dL — ABNORMAL HIGH (ref 0.76–1.27)
Globulin, Total: 2.4 g/dL (ref 1.5–4.5)
Glucose: 119 mg/dL — ABNORMAL HIGH (ref 70–99)
Potassium: 4.4 mmol/L (ref 3.5–5.2)
Sodium: 139 mmol/L (ref 134–144)
Total Protein: 6.5 g/dL (ref 6.0–8.5)
eGFR: 52 mL/min/1.73 — ABNORMAL LOW (ref >59)

## 2024-11-23 ENCOUNTER — Ambulatory Visit: Admitting: Family Medicine
